# Patient Record
Sex: Male | Born: 1960 | Race: Black or African American | Hispanic: No | Marital: Married | Smoking: Never smoker
Health system: Southern US, Community
[De-identification: ages and names within clinical notes are randomized; demographics above are authoritative.]

## PROBLEM LIST (undated history)

## (undated) DIAGNOSIS — R7989 Other specified abnormal findings of blood chemistry: Secondary | ICD-10-CM

## (undated) DIAGNOSIS — G4730 Sleep apnea, unspecified: Secondary | ICD-10-CM

## (undated) DIAGNOSIS — M1990 Unspecified osteoarthritis, unspecified site: Secondary | ICD-10-CM

## (undated) DIAGNOSIS — E663 Overweight: Secondary | ICD-10-CM

## (undated) DIAGNOSIS — I10 Essential (primary) hypertension: Secondary | ICD-10-CM

## (undated) DIAGNOSIS — G510 Bell's palsy: Secondary | ICD-10-CM

## (undated) DIAGNOSIS — Z860100 Personal history of colon polyps, unspecified: Secondary | ICD-10-CM

## (undated) DIAGNOSIS — E876 Hypokalemia: Secondary | ICD-10-CM

## (undated) DIAGNOSIS — G4733 Obstructive sleep apnea (adult) (pediatric): Secondary | ICD-10-CM

## (undated) DIAGNOSIS — N529 Male erectile dysfunction, unspecified: Secondary | ICD-10-CM

## (undated) DIAGNOSIS — E7800 Pure hypercholesterolemia, unspecified: Secondary | ICD-10-CM

## (undated) HISTORY — DX: Essential (primary) hypertension: I10

## (undated) HISTORY — DX: Sleep apnea, unspecified: G47.30

## (undated) HISTORY — DX: Bell's palsy: G51.0

## (undated) HISTORY — PX: COLONOSCOPY W/ POLYPECTOMY: SHX1380

---

## 1993-11-07 HISTORY — PX: HERNIA REPAIR: SHX51

## 2000-11-07 HISTORY — PX: MENISCUS REPAIR: SHX5179

## 2002-01-05 DIAGNOSIS — Z8669 Personal history of other diseases of the nervous system and sense organs: Secondary | ICD-10-CM

## 2006-03-31 ENCOUNTER — Ambulatory Visit: Payer: Self-pay | Admitting: Internal Medicine

## 2006-05-23 ENCOUNTER — Ambulatory Visit: Payer: Self-pay | Admitting: Internal Medicine

## 2007-11-21 ENCOUNTER — Ambulatory Visit: Payer: Self-pay | Admitting: Family Medicine

## 2007-11-21 DIAGNOSIS — M7750 Other enthesopathy of unspecified foot: Secondary | ICD-10-CM | POA: Insufficient documentation

## 2007-11-21 DIAGNOSIS — I10 Essential (primary) hypertension: Secondary | ICD-10-CM | POA: Insufficient documentation

## 2007-12-18 ENCOUNTER — Ambulatory Visit: Payer: Self-pay | Admitting: Family Medicine

## 2007-12-18 ENCOUNTER — Telehealth: Payer: Self-pay | Admitting: Family Medicine

## 2007-12-19 ENCOUNTER — Ambulatory Visit: Payer: Self-pay | Admitting: Family Medicine

## 2007-12-19 DIAGNOSIS — E876 Hypokalemia: Secondary | ICD-10-CM | POA: Insufficient documentation

## 2007-12-21 LAB — CONVERTED CEMR LAB
ALT: 58 units/L — ABNORMAL HIGH (ref 0–53)
AST: 45 units/L — ABNORMAL HIGH (ref 0–37)
Albumin: 3.6 g/dL (ref 3.5–5.2)
Alkaline Phosphatase: 56 units/L (ref 39–117)
CO2: 34 meq/L — ABNORMAL HIGH (ref 19–32)
Calcium: 9 mg/dL (ref 8.4–10.5)
Chloride: 99 meq/L (ref 96–112)
Chloride: 100 meq/L (ref 96–112)
Creatinine, Ser: 1.2 mg/dL (ref 0.4–1.5)
Creatinine, Ser: 1.3 mg/dL (ref 0.4–1.5)
GFR calc non Af Amer: 69 mL/min
Glucose, Bld: 126 mg/dL — ABNORMAL HIGH (ref 70–99)
Sodium: 140 meq/L (ref 135–145)
Total Bilirubin: 0.9 mg/dL (ref 0.3–1.2)
Total CHOL/HDL Ratio: 6.5
VLDL: 29 mg/dL (ref 0–40)

## 2007-12-25 ENCOUNTER — Ambulatory Visit: Payer: Self-pay | Admitting: Family Medicine

## 2007-12-25 DIAGNOSIS — R7309 Other abnormal glucose: Secondary | ICD-10-CM | POA: Insufficient documentation

## 2008-01-02 ENCOUNTER — Ambulatory Visit: Payer: Self-pay | Admitting: Family Medicine

## 2008-01-02 ENCOUNTER — Telehealth: Payer: Self-pay | Admitting: Family Medicine

## 2008-01-07 DIAGNOSIS — E7800 Pure hypercholesterolemia, unspecified: Secondary | ICD-10-CM

## 2008-01-18 LAB — CONVERTED CEMR LAB
BUN: 8 mg/dL (ref 6–23)
CO2: 34 meq/L — ABNORMAL HIGH (ref 19–32)
Creatinine, Ser: 1.3 mg/dL (ref 0.4–1.5)
Glucose, Bld: 123 mg/dL — ABNORMAL HIGH (ref 70–99)
Potassium: 2.6 meq/L — CL (ref 3.5–5.1)
Sodium: 140 meq/L (ref 135–145)

## 2008-03-05 ENCOUNTER — Ambulatory Visit: Payer: Self-pay | Admitting: Family Medicine

## 2008-03-06 ENCOUNTER — Telehealth: Payer: Self-pay | Admitting: Family Medicine

## 2008-03-06 ENCOUNTER — Ambulatory Visit: Payer: Self-pay | Admitting: Family Medicine

## 2008-03-10 ENCOUNTER — Ambulatory Visit: Payer: Self-pay | Admitting: Family Medicine

## 2008-03-12 ENCOUNTER — Ambulatory Visit: Payer: Self-pay | Admitting: Family Medicine

## 2008-03-12 LAB — CONVERTED CEMR LAB: Aldosterone, Serum: 5

## 2008-03-14 ENCOUNTER — Ambulatory Visit: Payer: Self-pay | Admitting: Family Medicine

## 2008-03-20 ENCOUNTER — Ambulatory Visit: Payer: Self-pay | Admitting: Family Medicine

## 2008-04-02 ENCOUNTER — Encounter (INDEPENDENT_AMBULATORY_CARE_PROVIDER_SITE_OTHER): Payer: Self-pay | Admitting: *Deleted

## 2008-05-22 ENCOUNTER — Encounter (INDEPENDENT_AMBULATORY_CARE_PROVIDER_SITE_OTHER): Payer: Self-pay | Admitting: *Deleted

## 2008-05-28 ENCOUNTER — Encounter (INDEPENDENT_AMBULATORY_CARE_PROVIDER_SITE_OTHER): Payer: Self-pay | Admitting: *Deleted

## 2009-02-05 ENCOUNTER — Ambulatory Visit: Payer: Self-pay | Admitting: Family Medicine

## 2009-02-05 DIAGNOSIS — M549 Dorsalgia, unspecified: Secondary | ICD-10-CM | POA: Insufficient documentation

## 2009-02-05 DIAGNOSIS — IMO0002 Reserved for concepts with insufficient information to code with codable children: Secondary | ICD-10-CM | POA: Insufficient documentation

## 2009-02-13 ENCOUNTER — Ambulatory Visit: Payer: Self-pay | Admitting: Family Medicine

## 2009-03-25 ENCOUNTER — Telehealth: Payer: Self-pay | Admitting: Family Medicine

## 2009-04-02 ENCOUNTER — Ambulatory Visit: Payer: Self-pay | Admitting: Family Medicine

## 2009-04-09 ENCOUNTER — Telehealth: Payer: Self-pay | Admitting: Family Medicine

## 2009-04-10 ENCOUNTER — Ambulatory Visit: Payer: Self-pay | Admitting: Family Medicine

## 2009-04-10 DIAGNOSIS — R059 Cough, unspecified: Secondary | ICD-10-CM | POA: Insufficient documentation

## 2009-04-10 DIAGNOSIS — R05 Cough: Secondary | ICD-10-CM

## 2009-04-24 ENCOUNTER — Encounter: Admission: RE | Admit: 2009-04-24 | Discharge: 2009-04-24 | Payer: Self-pay | Admitting: Orthopaedic Surgery

## 2009-07-27 ENCOUNTER — Telehealth: Payer: Self-pay | Admitting: Family Medicine

## 2009-11-16 ENCOUNTER — Telehealth: Payer: Self-pay | Admitting: Family Medicine

## 2010-08-08 ENCOUNTER — Ambulatory Visit (HOSPITAL_BASED_OUTPATIENT_CLINIC_OR_DEPARTMENT_OTHER): Admission: RE | Admit: 2010-08-08 | Discharge: 2010-08-08 | Payer: Self-pay | Admitting: Nurse Practitioner

## 2010-08-14 ENCOUNTER — Ambulatory Visit: Payer: Self-pay | Admitting: Internal Medicine

## 2010-12-07 NOTE — Progress Notes (Signed)
Summary: Avapro Prior Authorization  Phone Note Outgoing Call Call back at [phone]   Call placed by: Linde Gillis CMA Duncan Dull),  November 16, 2009 11:47 AM Call placed to: [facility] Summary of Call: Received faxed form from [facility] for Avapro 150mg , #60 which the pharmacist says needs prior authorization.  Called [facility] and spoke with Estevan Ryder, the patient's insurance plan only covers 31 tablets in a 24 day period with no prior authorization option.  Please advise.   Initial call taken by: Linde Gillis CMA Duncan Dull),  November 16, 2009 11:50 AM  Follow-up for Phone Call        Can you call and see if they cover the 300 mg tablet...without prior auth for 30..if so we can change to that... I believe the 2 tabs daily was leftover from titrating med dose up.  Please also make sure pt has an appt for BP check   in next few months. Follow-up by: Kerby Nora MD,  November 16, 2009 4:02 PM  Additional Follow-up for Phone Call Additional follow up Details #1::        Spoke with Britta Mccreedy at [facility], per his plan no prior Berkley Harvey is needed for the 300mg  tablets.  Patient advised that he needs an appt for BP check in the next few months and he said I could call in the new Rx for the 300mg  tablets to [facility].  #30 with 3 refills called to pharmacy.  He will call back to schedule appt. Additional Follow-up by: Linde Gillis CMA Duncan Dull),  November 16, 2009 4:41 PM     Appended Document: Orders Update    Clinical Lists Changes  Medications: Changed medication from AVAPRO 150 MG  TABS (IRBESARTAN) Take 2 tablet by mouth once a day to AVAPRO 300 MG TABS (IRBESARTAN) Take 1 tablet by mouth once a day - Signed Rx of AVAPRO 300 MG TABS (IRBESARTAN) Take 1 tablet by mouth once a day;  #30 x 3;  Signed;  Entered by: Kerby Nora MD;  Authorized by: Kerby Nora MD;  Method used: Telephoned to [facility] [address], [place], [place]  [address], Ph: [phone], Fax: [phone]    Prescriptions: AVAPRO 300 MG TABS  (IRBESARTAN) Take 1 tablet by mouth once a day  #30 x 3   Entered and Authorized by:   Kerby Nora MD   Signed by:   Kerby Nora MD on 11/17/2009   Method used:   Telephoned to ...       [facility]* (retail)       [address]       [place], [place]  [address]       Ph: [phone]       Fax: [phone]   RxID:   [id number]

## 2011-06-05 ENCOUNTER — Emergency Department (HOSPITAL_COMMUNITY)
Admission: EM | Admit: 2011-06-05 | Discharge: 2011-06-05 | Disposition: A | Discharge status: Home / Self Care | Payer: Self-pay | Attending: Emergency Medicine | Admitting: Emergency Medicine

## 2011-06-05 ENCOUNTER — Emergency Department (HOSPITAL_COMMUNITY): Payer: Self-pay

## 2011-06-05 DIAGNOSIS — S63509A Unspecified sprain of unspecified wrist, initial encounter: Secondary | ICD-10-CM | POA: Insufficient documentation

## 2011-06-05 DIAGNOSIS — M7989 Other specified soft tissue disorders: Secondary | ICD-10-CM | POA: Insufficient documentation

## 2011-06-05 DIAGNOSIS — Y9289 Other specified places as the place of occurrence of the external cause: Secondary | ICD-10-CM | POA: Insufficient documentation

## 2011-06-05 DIAGNOSIS — I10 Essential (primary) hypertension: Secondary | ICD-10-CM | POA: Insufficient documentation

## 2011-06-05 DIAGNOSIS — M25539 Pain in unspecified wrist: Secondary | ICD-10-CM | POA: Insufficient documentation

## 2011-06-05 DIAGNOSIS — W208XXA Other cause of strike by thrown, projected or falling object, initial encounter: Secondary | ICD-10-CM | POA: Insufficient documentation

## 2011-06-06 ENCOUNTER — Emergency Department (HOSPITAL_COMMUNITY)
Admission: EM | Admit: 2011-06-06 | Discharge: 2011-06-06 | Disposition: A | Discharge status: Home / Self Care | Payer: Worker's Compensation | Attending: Emergency Medicine | Admitting: Emergency Medicine

## 2011-06-06 DIAGNOSIS — Y990 Civilian activity done for income or pay: Secondary | ICD-10-CM | POA: Insufficient documentation

## 2011-06-06 DIAGNOSIS — M25439 Effusion, unspecified wrist: Secondary | ICD-10-CM | POA: Insufficient documentation

## 2011-06-06 DIAGNOSIS — Z79899 Other long term (current) drug therapy: Secondary | ICD-10-CM | POA: Insufficient documentation

## 2011-06-06 DIAGNOSIS — S6990XA Unspecified injury of unspecified wrist, hand and finger(s), initial encounter: Secondary | ICD-10-CM | POA: Insufficient documentation

## 2011-06-06 DIAGNOSIS — M25539 Pain in unspecified wrist: Secondary | ICD-10-CM | POA: Insufficient documentation

## 2011-06-06 DIAGNOSIS — S62009A Unspecified fracture of navicular [scaphoid] bone of unspecified wrist, initial encounter for closed fracture: Secondary | ICD-10-CM | POA: Insufficient documentation

## 2011-06-06 DIAGNOSIS — W208XXA Other cause of strike by thrown, projected or falling object, initial encounter: Secondary | ICD-10-CM | POA: Insufficient documentation

## 2011-06-06 DIAGNOSIS — I10 Essential (primary) hypertension: Secondary | ICD-10-CM | POA: Insufficient documentation

## 2011-06-06 DIAGNOSIS — S59909A Unspecified injury of unspecified elbow, initial encounter: Secondary | ICD-10-CM | POA: Insufficient documentation

## 2011-11-08 HISTORY — PX: COLONOSCOPY: SHX174

## 2012-01-31 ENCOUNTER — Telehealth: Payer: Self-pay | Admitting: Internal Medicine

## 2012-01-31 NOTE — Telephone Encounter (Signed)
[phone] Pt wife called to make new patient appointment made for 04/11/12 wanted to be seen sooner.  His bp is high yesterday 180/110 Pt wife stated bp is always high.  Pt dr could not get right combo of meds to control.  His dr left her practice and he has not primary care dr.

## 2012-02-01 NOTE — Telephone Encounter (Signed)
We can put him in next 30-45 min slot.

## 2012-02-01 NOTE — Telephone Encounter (Signed)
Appointment 03/06/12 pt wife aware of appointment

## 2012-03-06 ENCOUNTER — Ambulatory Visit: Payer: Self-pay | Admitting: Internal Medicine

## 2012-03-07 ENCOUNTER — Other Ambulatory Visit: Payer: Self-pay | Admitting: *Deleted

## 2012-03-07 MED ORDER — AMLODIPINE BESYLATE 10 MG PO TABS: 10.0000 mg | ORAL_TABLET | Freq: Every day | ORAL | Status: DC

## 2012-03-07 NOTE — Telephone Encounter (Signed)
30-day supply given for NP w/05.20.13 OV scheduled per VO JAW/SLS

## 2012-03-26 ENCOUNTER — Ambulatory Visit (INDEPENDENT_AMBULATORY_CARE_PROVIDER_SITE_OTHER): Payer: 59 | Admitting: Internal Medicine

## 2012-03-26 ENCOUNTER — Encounter: Payer: Self-pay | Admitting: Internal Medicine

## 2012-03-26 VITALS — BP 208/118 | HR 61 | Temp 97.4°F | Resp 16 | Ht 70.5 in | Wt 257.0 lb

## 2012-03-26 DIAGNOSIS — Z23 Encounter for immunization: Secondary | ICD-10-CM

## 2012-03-26 DIAGNOSIS — M25569 Pain in unspecified knee: Secondary | ICD-10-CM

## 2012-03-26 DIAGNOSIS — Z1211 Encounter for screening for malignant neoplasm of colon: Secondary | ICD-10-CM

## 2012-03-26 DIAGNOSIS — E1165 Type 2 diabetes mellitus with hyperglycemia: Secondary | ICD-10-CM | POA: Insufficient documentation

## 2012-03-26 DIAGNOSIS — M25562 Pain in left knee: Secondary | ICD-10-CM | POA: Insufficient documentation

## 2012-03-26 DIAGNOSIS — I10 Essential (primary) hypertension: Secondary | ICD-10-CM | POA: Insufficient documentation

## 2012-03-26 DIAGNOSIS — E119 Type 2 diabetes mellitus without complications: Secondary | ICD-10-CM

## 2012-03-26 MED ORDER — LOSARTAN POTASSIUM-HCTZ 100-25 MG PO TABS: 1.0000 | ORAL_TABLET | Freq: Every day | ORAL | Status: DC

## 2012-03-26 MED ORDER — AMLODIPINE BESYLATE 10 MG PO TABS: 10.0000 mg | ORAL_TABLET | Freq: Every day | ORAL | Status: DC

## 2012-03-26 NOTE — Progress Notes (Signed)
Subjective:    Patient ID: Bradley Perez, male    DOB: 1961-05-24, 51 y.o.   MRN: [id number]  HPI 51 year old male with history of hypertension presents to establish care. In regards to his hypertension, he reports that he has been out of his Norvasc. He has been taking his angiotensin receptor blocker and Bystolic. He denies any headache, palpitations, chest pain. He reports that he is generally feeling well.  He also has a history of prediabetes. He reports full compliance with metformin. He does not regularly check his blood sugars. He reports that his hemoglobin A1c has been near 7% in the past.  His primary concern today is pain and swelling in his left knee. This has been present for several months. He notices this primarily after playing sports such as basketball. He notes some swelling posterior to his left knee. He denies any known injury to his knee. He has not been taking any medication for this.  Outpatient Encounter Prescriptions as of 03/26/2012  Medication Sig Dispense Refill  . amLODipine (NORVASC) 10 MG tablet Take 1 tablet (10 mg total) by mouth daily.  30 tablet  6  . metFORMIN (GLUCOPHAGE) 500 MG tablet Take 500 mg by mouth daily with breakfast.      . multivitamin (ONE-A-DAY MEN'S) TABS Take 1 tablet by mouth daily.      . nebivolol (BYSTOLIC) 10 MG tablet Take 30 mg by mouth daily.      Marland Kitchen losartan-hydrochlorothiazide (HYZAAR) 100-25 MG per tablet Take 1 tablet by mouth daily.  30 tablet  6   BP 208/118  Pulse 61  Temp(Src) 97.4 F (36.3 C) (Oral)  Resp 16  Ht 5' 10.5" (1.791 m)  Wt 257 lb (116.574 kg)  BMI 36.35 kg/m2  SpO2 96%  Review of Systems  Constitutional: Negative for fever, chills, activity change, appetite change, fatigue and unexpected weight change.  Eyes: Negative for visual disturbance.  Respiratory: Negative for cough and shortness of breath.   Cardiovascular: Negative for chest pain, palpitations and leg swelling.  Gastrointestinal: Negative  for abdominal pain and abdominal distention.  Genitourinary: Negative for dysuria, urgency and difficulty urinating.  Musculoskeletal: Positive for myalgias, joint swelling and arthralgias. Negative for gait problem.  Skin: Negative for color change and rash.  Hematological: Negative for adenopathy.  Psychiatric/Behavioral: Negative for sleep disturbance and dysphoric mood. The patient is not nervous/anxious.    BP 208/118  Pulse 61  Temp(Src) 97.4 F (36.3 C) (Oral)  Resp 16  Ht 5' 10.5" (1.791 m)  Wt 257 lb (116.574 kg)  BMI 36.35 kg/m2  SpO2 96%     Objective:   Physical Exam  Constitutional: He is oriented to person, place, and time. He appears well-developed and well-nourished. No distress.  HENT:  Head: Normocephalic and atraumatic.  Right Ear: External ear normal.  Left Ear: External ear normal.  Nose: Nose normal.  Mouth/Throat: Oropharynx is clear and moist. No oropharyngeal exudate.  Eyes: Conjunctivae and EOM are normal. Pupils are equal, round, and reactive to light. Right eye exhibits no discharge. Left eye exhibits no discharge. No scleral icterus.  Neck: Normal range of motion. Neck supple. No tracheal deviation present. No thyromegaly present.  Cardiovascular: Normal rate, regular rhythm and normal heart sounds.  Exam reveals no gallop and no friction rub.   No murmur heard. Pulmonary/Chest: Effort normal and breath sounds normal. No respiratory distress. He has no wheezes. He has no rales. He exhibits no tenderness.  Abdominal: Soft. Bowel sounds are normal. He  exhibits no distension and no mass. There is no tenderness. There is no guarding.  Musculoskeletal: Normal range of motion. He exhibits no edema.       Left knee: He exhibits swelling and effusion. tenderness found.       Legs: Lymphadenopathy:    He has no cervical adenopathy.  Neurological: He is alert and oriented to person, place, and time. No cranial nerve deficit. Coordination normal.  Skin: Skin  is warm and dry. No rash noted. He is not diaphoretic. No erythema. No pallor.  Psychiatric: He has a normal mood and affect. His behavior is normal. Judgment and thought content normal.          Assessment & Plan:

## 2012-03-26 NOTE — Assessment & Plan Note (Signed)
Blood pressure markedly elevated today. He will restart Norvasc. Will change Edarby to losartan because of cost. Will continue Bystolic. Patient will monitor blood pressure at home and email with results. He will return to clinic in one week. We'll check renal function with labs today.

## 2012-03-26 NOTE — Assessment & Plan Note (Signed)
Question if he may have a Baker's cyst based on exam. Will set him up evaluation with orthopedics.

## 2012-03-26 NOTE — Assessment & Plan Note (Signed)
Will check A1c with labs today. We'll continue metformin.

## 2012-03-26 NOTE — Assessment & Plan Note (Signed)
Will set up screening colonoscopy. 

## 2012-03-27 ENCOUNTER — Telehealth: Payer: Self-pay | Admitting: *Deleted

## 2012-03-27 DIAGNOSIS — E876 Hypokalemia: Secondary | ICD-10-CM

## 2012-03-27 LAB — LIPID PANEL
HDL: 38.4 mg/dL — ABNORMAL LOW (ref >39.00)
Triglycerides: 301 mg/dL — ABNORMAL HIGH (ref 0.0–149.0)
VLDL: 60.2 mg/dL — ABNORMAL HIGH (ref 0.0–40.0)

## 2012-03-27 LAB — COMPREHENSIVE METABOLIC PANEL
ALT: 35 U/L (ref 0–53)
Alkaline Phosphatase: 71 U/L (ref 39–117)
BUN: 17 mg/dL (ref 6–23)
CO2: 28 mEq/L (ref 19–32)
Chloride: 99 mEq/L (ref 96–112)
GFR: 74.26 mL/min (ref >60.00)
Potassium: 2.7 mEq/L — CL (ref 3.5–5.1)
Total Bilirubin: 0.4 mg/dL (ref 0.3–1.2)
Total Protein: 7.4 g/dL (ref 6.0–8.3)

## 2012-03-27 LAB — MICROALBUMIN / CREATININE URINE RATIO: Creatinine,U: 230.4 mg/dL

## 2012-03-27 LAB — HEMOGLOBIN A1C: Hgb A1c MFr Bld: 7.5 % — ABNORMAL HIGH (ref 4.6–6.5)

## 2012-03-27 MED ORDER — POTASSIUM CHLORIDE 40 MEQ/15ML (20%) PO LIQD: ORAL | Status: DC

## 2012-03-27 NOTE — Telephone Encounter (Signed)
FYI: Critical Lab Report: Potassium 2.7

## 2012-03-27 NOTE — Telephone Encounter (Signed)
Per VO, Dr. Darrick Huntsman, after critical lab result: sent in Potassium 40 meq [twice daily on day 1 only, then] once daily to pharmacy; patient cannot swallow pill, must have liquid. Will repeat BMP when patient returns to office next week, order placed/SLS Patient's wife informed.

## 2012-03-28 ENCOUNTER — Other Ambulatory Visit: Payer: Self-pay | Admitting: *Deleted

## 2012-03-28 MED ORDER — ATORVASTATIN CALCIUM 20 MG PO TABS: 20.0000 mg | ORAL_TABLET | Freq: Every day | ORAL | Status: DC

## 2012-04-05 ENCOUNTER — Ambulatory Visit: Payer: Self-pay | Admitting: Internal Medicine

## 2012-04-11 ENCOUNTER — Ambulatory Visit: Payer: Self-pay | Admitting: Internal Medicine

## 2012-04-18 ENCOUNTER — Ambulatory Visit: Payer: Self-pay | Admitting: Internal Medicine

## 2012-04-18 DIAGNOSIS — Z0289 Encounter for other administrative examinations: Secondary | ICD-10-CM

## 2012-05-17 ENCOUNTER — Ambulatory Visit (AMBULATORY_SURGERY_CENTER): Payer: 59

## 2012-05-17 VITALS — Ht 71.5 in | Wt 262.0 lb

## 2012-05-17 DIAGNOSIS — Z1211 Encounter for screening for malignant neoplasm of colon: Secondary | ICD-10-CM

## 2012-05-17 MED ORDER — MOVIPREP 100 G PO SOLR: ORAL | Status: DC

## 2012-05-18 ENCOUNTER — Encounter: Payer: Self-pay | Admitting: Internal Medicine

## 2012-05-31 ENCOUNTER — Encounter: Payer: Self-pay | Admitting: Internal Medicine

## 2012-05-31 ENCOUNTER — Ambulatory Visit (AMBULATORY_SURGERY_CENTER): Payer: 59 | Admitting: Internal Medicine

## 2012-05-31 VITALS — BP 178/93 | HR 79 | Temp 96.9°F | Resp 24 | Ht 71.5 in | Wt 262.0 lb

## 2012-05-31 DIAGNOSIS — Z1211 Encounter for screening for malignant neoplasm of colon: Secondary | ICD-10-CM

## 2012-05-31 DIAGNOSIS — D126 Benign neoplasm of colon, unspecified: Secondary | ICD-10-CM

## 2012-05-31 LAB — GLUCOSE, CAPILLARY: Glucose-Capillary: 165 mg/dL — ABNORMAL HIGH (ref 70–99)

## 2012-05-31 LAB — HM COLONOSCOPY

## 2012-05-31 MED ORDER — SODIUM CHLORIDE 0.9 % IV SOLN: 500.0000 mL | INTRAVENOUS | Status: DC

## 2012-05-31 NOTE — Patient Instructions (Addendum)

## 2012-05-31 NOTE — Addendum Note (Signed)
Addended by: Ginette Pitman on: 05/31/2012 11:26 AM   Modules accepted: Orders

## 2012-05-31 NOTE — Op Note (Signed)
[facility] [address] [place], [place]  [address]  COLONOSCOPY PROCEDURE REPORT  PATIENT:  Bradley Perez, Bradley Perez  MR#:  [id number] BIRTHDATE:  1961/03/08, 50 yrs. old  GENDER:  male ENDOSCOPIST:  Carie Caddy. Kyden Potash, MD REF. BY:  Ronna Polio, M.D. PROCEDURE DATE:  05/31/2012 PROCEDURE:  Colon with cold biopsy polypectomy ASA CLASS:  Class II INDICATIONS:  Routine Risk Screening, 1st colonoscopy MEDICATIONS:   MAC sedation, administered by CRNA, propofol (Diprivan) 250 mg IV  DESCRIPTION OF PROCEDURE:   After the risks benefits and alternatives of the procedure were thoroughly explained, informed consent was obtained.  Digital rectal exam was performed and revealed no rectal masses.   The LB CF-[id number] endoscope was introduced through the anus and advanced to the terminal ileum which was intubated for a short distance, without limitations. The quality of the prep was good, using MoviPrep.  The instrument was then slowly withdrawn as the colon was fully examined. <<PROCEDUREIMAGES>>  FINDINGS:  The terminal ileum appeared normal.  A 3 mm sessile polyp was found in the transverse colon. The polyp was removed using cold biopsy forceps.  A 3 mm sessile polyp was found in the recto-sigmoid colon. The polyp was removed using cold biopsy forceps.  A 2 mm sessile polyp was found in the rectum. The polyp was removed using cold biopsy forceps.   Retroflexed views in the rectum revealed no abnormalities.     The scope was then withdrawn from the cecum and the procedure completed.  COMPLICATIONS:  None  ENDOSCOPIC IMPRESSION: 1) Normal terminal ileum 2) Sessile polyp in the transverse colon.  Removed and sent to pathology. 3) Sessile polyp in the recto-sigmoid colon. Removed and sent to pathology. 4) Sessile polyp in the rectum. Removed and sent to pathology.  RECOMMENDATIONS: 1) Await pathology results 2) If the polyps removed today are proven to be  adenomatous (pre-cancerous) polyps, you will need a colonoscopy in 3 years. If only 1 or 2 is adenomatous, then you will need a repeat colonoscopy in 5 years. Otherwise you should continue to follow colorectal cancer screening guidelines for "routine risk" patients with a colonoscopy in 10 years. You will receive a letter within 1-2 weeks with the results of your biopsy as well as final recommendations. Please call my office if you have not received a letter after 3 weeks.  Carie Caddy. Rhea Belton, MD  CC:  The Patient Ronna Polio MD  n. eSIGNEDCarie Caddy. Audrianna Driskill at 05/31/2012 10:39 AM  Albertha Ghee, [phone]

## 2012-05-31 NOTE — Progress Notes (Signed)
Patient did not experience any of the following events: a burn prior to discharge; a fall within the facility; wrong site/side/patient/procedure/implant event; or a hospital transfer or hospital admission upon discharge from the facility. (G8907) Patient did not have preoperative order for IV antibiotic SSI prophylaxis. (G8918)  

## 2012-06-01 ENCOUNTER — Telehealth: Payer: Self-pay | Admitting: *Deleted

## 2012-06-01 NOTE — Telephone Encounter (Signed)
NO ANSWER, MESSAGE LEFT FOR THE PATIENT. 

## 2012-06-07 ENCOUNTER — Encounter: Payer: Self-pay | Admitting: Internal Medicine

## 2012-07-30 ENCOUNTER — Other Ambulatory Visit: Payer: Self-pay | Admitting: Orthopedic Surgery

## 2012-07-30 ENCOUNTER — Encounter (HOSPITAL_COMMUNITY): Payer: Self-pay | Admitting: Pharmacy Technician

## 2012-07-30 MED ORDER — DEXAMETHASONE SODIUM PHOSPHATE 10 MG/ML IJ SOLN: 10.0000 mg | Freq: Once | INTRAMUSCULAR | Status: DC

## 2012-07-30 NOTE — Progress Notes (Signed)
Preoperative surgical orders have been place into the Epic hospital system for Bradley Perez on 07/30/2012, 9:54 AM  by Patrica Duel for surgery on 08/08/2012.  Preop Knee Scope orders including IV Tylenol and IV Decadron as long as there are no contraindications to the above medications. Avel Peace, PA-C

## 2012-08-06 ENCOUNTER — Other Ambulatory Visit: Payer: Self-pay

## 2012-08-06 ENCOUNTER — Encounter (HOSPITAL_COMMUNITY)
Admission: RE | Admit: 2012-08-06 | Discharge: 2012-08-06 | Disposition: A | Discharge status: Home / Self Care | Payer: 59 | Source: Ambulatory Visit | Attending: Orthopedic Surgery | Admitting: Orthopedic Surgery

## 2012-08-06 ENCOUNTER — Ambulatory Visit (HOSPITAL_COMMUNITY)
Admission: RE | Admit: 2012-08-06 | Discharge: 2012-08-06 | Disposition: A | Discharge status: Home / Self Care | Payer: 59 | Source: Ambulatory Visit | Attending: Orthopedic Surgery | Admitting: Orthopedic Surgery

## 2012-08-06 DIAGNOSIS — Z01818 Encounter for other preprocedural examination: Secondary | ICD-10-CM | POA: Insufficient documentation

## 2012-08-06 DIAGNOSIS — Z01810 Encounter for preprocedural cardiovascular examination: Secondary | ICD-10-CM | POA: Insufficient documentation

## 2012-08-06 DIAGNOSIS — Z01812 Encounter for preprocedural laboratory examination: Secondary | ICD-10-CM | POA: Insufficient documentation

## 2012-08-06 LAB — CBC
HCT: 43.1 % (ref 39.0–52.0)
Hemoglobin: 15.4 g/dL (ref 13.0–17.0)
MCHC: 35.7 g/dL (ref 30.0–36.0)
MCV: 78.4 fL (ref 78.0–100.0)
WBC: 8.3 10*3/uL (ref 4.0–10.5)

## 2012-08-06 LAB — SURGICAL PCR SCREEN: Staphylococcus aureus: NEGATIVE

## 2012-08-06 LAB — BASIC METABOLIC PANEL
CO2: 33 mEq/L — ABNORMAL HIGH (ref 19–32)
Chloride: 94 mEq/L — ABNORMAL LOW (ref 96–112)
Glucose, Bld: 250 mg/dL — ABNORMAL HIGH (ref 70–99)
Sodium: 138 mEq/L (ref 135–145)

## 2012-08-06 NOTE — Patient Instructions (Signed)
20      Your procedure is scheduled on:  Wednesday 08/08/2012 at 1200 pm  Report to [facility] at  0930  AM.  Call this number if you have problems the morning of surgery: [phone]   Remember:   Do not eat food or drink liquids after midnight!  Take these medicines the morning of surgery with A SIP OF WATER: Norvasc, Atorvastatin   Do not bring valuables to the hospital.  .  Leave suitcase in the car. After surgery it may be brought to your room.  For patients admitted to the hospital, checkout time is 11:00 AM the day of              Discharge.    Special Instructions: See [facility] Preparing  For Surgery Instruction Sheet. Do not wear jewelry, lotions powders, perfumes. Women do not shave legs or underarms for 12 hours before showers. Contacts, partial plates, or dentures may not be worn into surgery.                          Patients discharged the day of surgery will not be allowed to drive home.  If going home the same day of surgery, must have someone stay with you first 24 hrs.at home and arrange for someone to drive you home from the              Hospital.   Please read over the following fact sheets that you were given: MRSA              INFORMATION, Incentive Spirometry sheet, Sleep apnea sheet               Telford Nab.Joselynne Killam,RN,BSN [phone]

## 2012-08-06 NOTE — Progress Notes (Signed)
08/06/12 1346  OBSTRUCTIVE SLEEP APNEA  Have you ever been diagnosed with sleep apnea through a sleep study? No  Do you snore loudly (loud enough to be heard through closed doors)?  1  Do you often feel tired, fatigued, or sleepy during the daytime? 1  Has anyone observed you stop breathing during your sleep? 0  Do you have, or are you being treated for high blood pressure? 1  BMI more than 35 kg/m2? 1  Age over 51 years old? 1  Neck circumference greater than 40 cm/18 inches? 1  Gender: 1  Obstructive Sleep Apnea Score 7   Score 4 or greater  Results sent to PCP

## 2012-08-08 ENCOUNTER — Encounter (HOSPITAL_COMMUNITY): Payer: Self-pay | Admitting: Anesthesiology

## 2012-08-08 ENCOUNTER — Other Ambulatory Visit: Payer: Self-pay | Admitting: Orthopedic Surgery

## 2012-08-08 ENCOUNTER — Encounter (HOSPITAL_COMMUNITY): Payer: Self-pay | Admitting: *Deleted

## 2012-08-08 ENCOUNTER — Ambulatory Visit (HOSPITAL_COMMUNITY)
Admission: RE | Admit: 2012-08-08 | Discharge: 2012-08-08 | Disposition: A | Discharge status: Home / Self Care | Payer: 59 | Source: Ambulatory Visit | Attending: Orthopedic Surgery | Admitting: Orthopedic Surgery

## 2012-08-08 ENCOUNTER — Encounter (HOSPITAL_COMMUNITY)
Admission: RE | Disposition: A | Discharge status: Home / Self Care | Payer: Self-pay | Source: Ambulatory Visit | Attending: Orthopedic Surgery

## 2012-08-08 ENCOUNTER — Ambulatory Visit (HOSPITAL_COMMUNITY): Payer: 59 | Admitting: Anesthesiology

## 2012-08-08 DIAGNOSIS — Z79899 Other long term (current) drug therapy: Secondary | ICD-10-CM | POA: Insufficient documentation

## 2012-08-08 DIAGNOSIS — M2340 Loose body in knee, unspecified knee: Secondary | ICD-10-CM | POA: Insufficient documentation

## 2012-08-08 DIAGNOSIS — E119 Type 2 diabetes mellitus without complications: Secondary | ICD-10-CM | POA: Insufficient documentation

## 2012-08-08 DIAGNOSIS — S83249A Other tear of medial meniscus, current injury, unspecified knee, initial encounter: Secondary | ICD-10-CM | POA: Diagnosis present

## 2012-08-08 DIAGNOSIS — I10 Essential (primary) hypertension: Secondary | ICD-10-CM | POA: Insufficient documentation

## 2012-08-08 DIAGNOSIS — X58XXXA Exposure to other specified factors, initial encounter: Secondary | ICD-10-CM | POA: Insufficient documentation

## 2012-08-08 DIAGNOSIS — M9420 Chondromalacia, unspecified site: Secondary | ICD-10-CM | POA: Insufficient documentation

## 2012-08-08 DIAGNOSIS — IMO0002 Reserved for concepts with insufficient information to code with codable children: Secondary | ICD-10-CM | POA: Insufficient documentation

## 2012-08-08 HISTORY — PX: KNEE ARTHROSCOPY: SHX127

## 2012-08-08 LAB — GLUCOSE, CAPILLARY
Glucose-Capillary: 225 mg/dL — ABNORMAL HIGH (ref 70–99)
Glucose-Capillary: 168 mg/dL — ABNORMAL HIGH (ref 70–99)

## 2012-08-08 SURGERY — ARTHROSCOPY, KNEE
Anesthesia: General | Site: Knee | Laterality: Left | Wound class: Clean

## 2012-08-08 MED ORDER — OXYCODONE-ACETAMINOPHEN 5-325 MG PO TABS: 1.0000 | ORAL_TABLET | ORAL | Status: DC | PRN

## 2012-08-08 MED ORDER — PROMETHAZINE HCL 25 MG/ML IJ SOLN: 6.2500 mg | INTRAMUSCULAR | Status: DC | PRN

## 2012-08-08 MED ORDER — ACETAMINOPHEN 10 MG/ML IV SOLN
INTRAVENOUS | Status: AC
  Filled 2012-08-08: qty 100

## 2012-08-08 MED ORDER — AMLODIPINE BESYLATE 10 MG PO TABS
10.0000 mg | ORAL_TABLET | ORAL | Status: AC
  Administered 2012-08-08: 10 mg via ORAL
  Filled 2012-08-08: qty 1

## 2012-08-08 MED ORDER — FENTANYL CITRATE 0.05 MG/ML IJ SOLN
INTRAMUSCULAR | Status: DC | PRN
  Administered 2012-08-08 (×4): 50 ug via INTRAVENOUS

## 2012-08-08 MED ORDER — MIDAZOLAM HCL 5 MG/5ML IJ SOLN
INTRAMUSCULAR | Status: DC | PRN
  Administered 2012-08-08: 1 mg via INTRAVENOUS

## 2012-08-08 MED ORDER — CEFAZOLIN SODIUM-DEXTROSE 2-3 GM-% IV SOLR
INTRAVENOUS | Status: AC
  Filled 2012-08-08: qty 50

## 2012-08-08 MED ORDER — BUPIVACAINE-EPINEPHRINE PF 0.25-1:200000 % IJ SOLN
INTRAMUSCULAR | Status: AC
  Filled 2012-08-08: qty 30

## 2012-08-08 MED ORDER — FENTANYL CITRATE 0.05 MG/ML IJ SOLN
25.0000 ug | INTRAMUSCULAR | Status: DC | PRN
  Administered 2012-08-08 (×3): 50 ug via INTRAVENOUS

## 2012-08-08 MED ORDER — DEXTROSE 5 % IV SOLN
3.0000 g | INTRAVENOUS | Status: AC
  Administered 2012-08-08: 2 g via INTRAVENOUS
  Filled 2012-08-08: qty 3000

## 2012-08-08 MED ORDER — BUPIVACAINE-EPINEPHRINE 0.25% -1:200000 IJ SOLN
INTRAMUSCULAR | Status: DC | PRN
  Administered 2012-08-08: 20 mL

## 2012-08-08 MED ORDER — ACETAMINOPHEN 10 MG/ML IV SOLN: 1000.0000 mg | Freq: Once | INTRAVENOUS | Status: DC

## 2012-08-08 MED ORDER — PROPOFOL 10 MG/ML IV EMUL
INTRAVENOUS | Status: DC | PRN
  Administered 2012-08-08: 250 mg via INTRAVENOUS

## 2012-08-08 MED ORDER — LACTATED RINGERS IR SOLN
Status: DC | PRN
  Administered 2012-08-08: 6000 mL

## 2012-08-08 MED ORDER — INSULIN ASPART 100 UNIT/ML ~~LOC~~ SOLN
4.0000 [IU] | Freq: Once | SUBCUTANEOUS | Status: AC
  Administered 2012-08-08: 4 [IU] via SUBCUTANEOUS
  Filled 2012-08-08: qty 1

## 2012-08-08 MED ORDER — LIDOCAINE HCL (CARDIAC) 20 MG/ML IV SOLN
INTRAVENOUS | Status: DC | PRN
  Administered 2012-08-08: 30 mg via INTRAVENOUS

## 2012-08-08 MED ORDER — KETAMINE HCL 10 MG/ML IJ SOLN
INTRAMUSCULAR | Status: DC | PRN
  Administered 2012-08-08 (×2): 5 mg via INTRAVENOUS

## 2012-08-08 MED ORDER — SODIUM CHLORIDE 0.9 % IV SOLN: INTRAVENOUS | Status: DC

## 2012-08-08 MED ORDER — ONDANSETRON HCL 4 MG/2ML IJ SOLN
INTRAMUSCULAR | Status: DC | PRN
  Administered 2012-08-08 (×2): 2 mg via INTRAVENOUS

## 2012-08-08 MED ORDER — METHOCARBAMOL 500 MG PO TABS: 500.0000 mg | ORAL_TABLET | Freq: Four times a day (QID) | ORAL | Status: DC

## 2012-08-08 MED ORDER — FENTANYL CITRATE 0.05 MG/ML IJ SOLN
INTRAMUSCULAR | Status: AC
  Filled 2012-08-08: qty 2

## 2012-08-08 MED ORDER — ACETAMINOPHEN 10 MG/ML IV SOLN
INTRAVENOUS | Status: DC | PRN
  Administered 2012-08-08: 1000 mg via INTRAVENOUS

## 2012-08-08 MED ORDER — LACTATED RINGERS IV SOLN
INTRAVENOUS | Status: DC
  Administered 2012-08-08: 1000 mL via INTRAVENOUS

## 2012-08-08 MED ORDER — LACTATED RINGERS IV SOLN
INTRAVENOUS | Status: DC | PRN
  Administered 2012-08-08: 12:00:00 via INTRAVENOUS

## 2012-08-08 SURGICAL SUPPLY — 28 items
BANDAGE ELASTIC 6 VELCRO ST LF (GAUZE/BANDAGES/DRESSINGS) ×1 IMPLANT
BLADE 4.2CUDA (BLADE) ×2 IMPLANT
CLOTH BEACON ORANGE TIMEOUT ST (SAFETY) ×2 IMPLANT
CUFF TOURN SGL QUICK 34 (TOURNIQUET CUFF) ×2
CUFF TRNQT CYL 34X4X40X1 (TOURNIQUET CUFF) ×1 IMPLANT
DRAPE U-SHAPE 47X51 STRL (DRAPES) ×2 IMPLANT
DRSG EMULSION OIL 3X3 NADH (GAUZE/BANDAGES/DRESSINGS) ×2 IMPLANT
DRSG PAD ABDOMINAL 8X10 ST (GAUZE/BANDAGES/DRESSINGS) ×2 IMPLANT
DURAPREP 26ML APPLICATOR (WOUND CARE) ×2 IMPLANT
GLOVE BIO SURGEON STRL SZ7.5 (GLOVE) ×2 IMPLANT
GLOVE BIO SURGEON STRL SZ8 (GLOVE) ×2 IMPLANT
GLOVE BIOGEL PI IND STRL 8 (GLOVE) ×1 IMPLANT
GLOVE BIOGEL PI INDICATOR 8 (GLOVE) ×1
GOWN STRL NON-REIN LRG LVL3 (GOWN DISPOSABLE) ×2 IMPLANT
MANIFOLD NEPTUNE II (INSTRUMENTS) ×4 IMPLANT
PACK ARTHROSCOPY WL (CUSTOM PROCEDURE TRAY) ×2 IMPLANT
PACK ICE MAXI GEL EZY WRAP (MISCELLANEOUS) ×3 IMPLANT
PADDING CAST ABS 6INX4YD NS (CAST SUPPLIES) ×1
PADDING CAST ABS COTTON 6X4 NS (CAST SUPPLIES) IMPLANT
PADDING CAST COTTON 6X4 STRL (CAST SUPPLIES) ×2 IMPLANT
POSITIONER SURGICAL ARM (MISCELLANEOUS) ×2 IMPLANT
SET ARTHROSCOPY TUBING (MISCELLANEOUS) ×2
SET ARTHROSCOPY TUBING LN (MISCELLANEOUS) ×1 IMPLANT
SPONGE GAUZE 4X4 12PLY (GAUZE/BANDAGES/DRESSINGS) ×1 IMPLANT
SUT ETHILON 4 0 PS 2 18 (SUTURE) ×2 IMPLANT
TOWEL OR 17X26 10 PK STRL BLUE (TOWEL DISPOSABLE) ×2 IMPLANT
WAND 90 DEG TURBOVAC W/CORD (SURGICAL WAND) ×2 IMPLANT
WRAP KNEE MAXI GEL POST OP (GAUZE/BANDAGES/DRESSINGS) ×2 IMPLANT

## 2012-08-08 NOTE — Interval H&P Note (Signed)
History and Physical Interval Note:  08/08/2012 11:54 AM  Bradley Perez  has presented today for surgery, with the diagnosis of Left Knee Medial Mensical Tear  The various methods of treatment have been discussed with the patient and family. After consideration of risks, benefits and other options for treatment, the patient has consented to  Procedure(s) (LRB) with comments: ARTHROSCOPY KNEE (Left) - with debridement as a surgical intervention .  The patient's history has been reviewed, patient examined, no change in status, stable for surgery.  I have reviewed the patient's chart and labs.  Questions were answered to the patient's satisfaction.     Loanne Drilling

## 2012-08-08 NOTE — Progress Notes (Signed)
Ortho tech here to teach pt how to ambulate with crutches toe touching with left foot. Pt returns demonstration. Ice pack for left knee given to pt to take home.

## 2012-08-08 NOTE — Transfer of Care (Signed)
Immediate Anesthesia Transfer of Care Note  Patient: Bradley Perez  Procedure(s) Performed: Procedure(s) (LRB) with comments: ARTHROSCOPY KNEE (Left) - medial meniscal debridement  Patient Location: PACU  Anesthesia Type: General  Level of Consciousness: awake and sedated  Airway & Oxygen Therapy: Patient Spontanous Breathing, Patient connected to face mask and Patient connected to tracheostomy mask oxygen  Post-op Assessment: Report given to PACU RN and Patient moving all extremities X 4  Post vital signs: Reviewed and stable  Complications: No apparent anesthesia complications

## 2012-08-08 NOTE — Progress Notes (Addendum)
Reported elevated BP and elevated blood sugar of 225  to Dr Okey Dupre and orders received

## 2012-08-08 NOTE — Anesthesia Postprocedure Evaluation (Signed)
  Anesthesia Post-op Note  Patient: Bradley Perez  Procedure(s) Performed: Procedure(s) (LRB): ARTHROSCOPY KNEE (Left)  Patient Location: PACU  Anesthesia Type: General  Level of Consciousness: awake and alert   Airway and Oxygen Therapy: Patient Spontanous Breathing  Post-op Pain: mild  Post-op Assessment: Post-op Vital signs reviewed, Patient's Cardiovascular Status Stable, Respiratory Function Stable, Patent Airway and No signs of Nausea or vomiting  Post-op Vital Signs: stable  Complications: No apparent anesthesia complications

## 2012-08-08 NOTE — Op Note (Signed)
Preoperative diagnosis-  Left knee medial meniscal tear  Postoperative diagnosis Left- knee medial meniscal tear   Procedure- Left knee arthroscopy with medial Meniscal debridement   Surgeon- Gus Rankin. Rachell Druckenmiller, MD  Anesthesia-General  EBL-  minimal Complications- None  Condition- PACU - hemodynamically stable.  Brief clinical note- -Bradley Perez is a 51 y.o.  male with a several month history of left knee pain and mechanical symptoms. Exam and history suggested medial meniscal tear confirmed by MRI. The patient presents now for arthroscopy and debridement   Procedure in detail -       After successful administration of General anesthetic, a tourmiquet is placed high on the Left  thigh and the Left lower extremity is prepped and draped in the usual sterile fashion. Time out is performed by the surgical team. Standard superomedial and inferolateral portal sites are marked and incisions made with an 11 blade. The inflow cannula is passed through the superomedial portal and camera through the inferolateral portal and inflow is initiated. Arthroscopic visualization proceeds.      The undersurface of the patella and trochlea are visualized and there is grade II/III chondromalacia of the trochlea without any unstable lesions. The medial and lateral gutters are visualized and there are several loose bodies which were subsequently removed. Flexion and valgus force is applied to the knee and the medial compartment is entered. A spinal needle is passed into the joint through the site marked for the inferomedial portal. A small incision is made and the dilator passed into the joint. The findings for the medial compartment are tear of body and posterior horn medial meniscus with mild medial femoral condyle chondromalacia . The tear is debrided to a stable base with baskets and a shaver and sealed off with the Arthrocare. The condyle did not need any debridement.    The intercondylar notch is visualized  and the ACL appears normal. The lateral compartment is entered and the findings are normal .      The joint is again inspected and there are no other tears, defects or loose bodies identified. The arthroscopic equipment is then removed from the inferior portals which are closed with interrupted 4-0 nylon. 20 ml of .25% Marcaine with epinephrine are injected through the inflow cannula and the cannula is then removed and the portal closed with nylon. The incisions are cleaned and dried and a bulky sterile dressing is applied. The patient is then awakened and transported to recovery in stable condition.   08/08/2012, 12:44 PM

## 2012-08-08 NOTE — H&P (Signed)
  CC- Bradley Perez is a 51 y.o. male who presents with left knee pain.  HPI- . Knee Pain: Patient presents with knee pain involving the  left knee. Onset of the symptoms was several months ago. Inciting event: felt pop playing basketball. Current symptoms include giving out, pain located medially, popping sensation and stiffness. Pain is aggravated by going up and down stairs, pivoting, rising after sitting, squatting and walking.  Patient has had no prior knee problems. Evaluation to date: MRI: abnormal medial meniscal tear. Treatment to date: avoidance of offending activity and rest.  Past Medical History  Diagnosis Date  . Hypertension   . Bell's palsy   . Diabetes mellitus     Past Surgical History  Procedure Date  . Meniscus repair 2002  . Hernia repair 1995    vental wall    Prior to Admission medications   Medication Sig Start Date End Date Taking? Authorizing Provider  amLODipine (NORVASC) 10 MG tablet Take 10 mg by mouth every morning.    Historical Provider, MD  atorvastatin (LIPITOR) 20 MG tablet Take 20 mg by mouth daily after breakfast.    Historical Provider, MD  Azilsartan-Chlorthalidone (EDARBYCLOR PO) Take 25 mg by mouth every morning.     Historical Provider, MD  ibuprofen (ADVIL,MOTRIN) 200 MG tablet Take 400 mg by mouth every 6 (six) hours as needed. For pain    Historical Provider, MD  losartan-hydrochlorothiazide (HYZAAR) 100-25 MG per tablet Take 1 tablet by mouth daily after breakfast.    Historical Provider, MD  metFORMIN (GLUCOPHAGE) 500 MG tablet Take 500 mg by mouth daily with breakfast.    Historical Provider, MD   KNEE EXAM antalgic gait, soft tissue tenderness over medial joint line, reduced range of motion, negative drawer sign, collateral ligaments intact, normal ipsilateral hip exam  Physical Examination: General appearance - alert, well appearing, and in no distress Mental status - alert, oriented to person, place, and time Chest - clear to  auscultation, no wheezes, rales or rhonchi, symmetric air entry Heart - normal rate, regular rhythm, normal S1, S2, no murmurs, rubs, clicks or gallops Abdomen - soft, nontender, nondistended, no masses or organomegaly Neurological - alert, oriented, normal speech, no focal findings or movement disorder noted   Asessment/Plan--- Left knee medial meniscal tear- - Plan left knee arthroscopy with meniscal debridement. Procedure risks and potential comps discussed with patient who elects to proceed. Goals are decreased pain and increased function with a high likelihood of achieving both

## 2012-08-08 NOTE — Anesthesia Preprocedure Evaluation (Signed)
Anesthesia Evaluation  Patient identified by MRN, date of birth, ID band Patient awake    Reviewed: Allergy & Precautions, H&P , NPO status , Patient's Chart, lab work & pertinent test results  Airway Mallampati: II TM Distance: >3 FB Neck ROM: Full    Dental No notable dental hx.    Pulmonary neg pulmonary ROS,  breath sounds clear to auscultation  Pulmonary exam normal       Cardiovascular hypertension, Pt. on medications and Pt. on home beta blockers negative cardio ROS  Rhythm:Regular Rate:Normal     Neuro/Psych negative neurological ROS  negative psych ROS   GI/Hepatic negative GI ROS, Neg liver ROS,   Endo/Other  diabetes, Type 2, Oral Hypoglycemic Agents  Renal/GU negative Renal ROS  negative genitourinary   Musculoskeletal negative musculoskeletal ROS (+)   Abdominal   Peds negative pediatric ROS (+)  Hematology negative hematology ROS (+)   Anesthesia Other Findings   Reproductive/Obstetrics negative OB ROS                           Anesthesia Physical Anesthesia Plan  ASA: III  Anesthesia Plan: General   Post-op Pain Management:    Induction: Intravenous  Airway Management Planned: LMA  Additional Equipment:   Intra-op Plan:   Post-operative Plan: Extubation in OR  Informed Consent: I have reviewed the patients History and Physical, chart, labs and discussed the procedure including the risks, benefits and alternatives for the proposed anesthesia with the patient or authorized representative who has indicated his/her understanding and acceptance.   Dental advisory given  Plan Discussed with: CRNA  Anesthesia Plan Comments:         Anesthesia Quick Evaluation

## 2012-08-09 ENCOUNTER — Encounter (HOSPITAL_COMMUNITY): Payer: Self-pay | Admitting: Orthopedic Surgery

## 2012-10-02 ENCOUNTER — Telehealth: Payer: Self-pay | Admitting: General Practice

## 2012-10-02 ENCOUNTER — Ambulatory Visit (INDEPENDENT_AMBULATORY_CARE_PROVIDER_SITE_OTHER): Payer: 59 | Admitting: Internal Medicine

## 2012-10-02 ENCOUNTER — Encounter: Payer: Self-pay | Admitting: Internal Medicine

## 2012-10-02 VITALS — BP 140/86 | HR 75 | Temp 98.6°F | Resp 16 | Wt 248.8 lb

## 2012-10-02 DIAGNOSIS — I10 Essential (primary) hypertension: Secondary | ICD-10-CM

## 2012-10-02 DIAGNOSIS — R81 Glycosuria: Secondary | ICD-10-CM

## 2012-10-02 DIAGNOSIS — E878 Other disorders of electrolyte and fluid balance, not elsewhere classified: Secondary | ICD-10-CM

## 2012-10-02 DIAGNOSIS — E1165 Type 2 diabetes mellitus with hyperglycemia: Secondary | ICD-10-CM

## 2012-10-02 DIAGNOSIS — E876 Hypokalemia: Secondary | ICD-10-CM

## 2012-10-02 DIAGNOSIS — E785 Hyperlipidemia, unspecified: Secondary | ICD-10-CM | POA: Insufficient documentation

## 2012-10-02 DIAGNOSIS — E119 Type 2 diabetes mellitus without complications: Secondary | ICD-10-CM

## 2012-10-02 LAB — POCT URINALYSIS DIPSTICK
Ketones, UA: NEGATIVE
Leukocytes, UA: NEGATIVE
Spec Grav, UA: 1.02
Urobilinogen, UA: 0.2
pH, UA: 7

## 2012-10-02 LAB — COMPREHENSIVE METABOLIC PANEL
CO2: 31 mEq/L (ref 19–32)
Creatinine, Ser: 1.4 mg/dL (ref 0.4–1.5)
GFR: 69.78 mL/min (ref >60.00)
Glucose, Bld: 168 mg/dL — ABNORMAL HIGH (ref 70–99)
Total Bilirubin: 1 mg/dL (ref 0.3–1.2)

## 2012-10-02 LAB — LIPID PANEL
Cholesterol: 225 mg/dL — ABNORMAL HIGH (ref 0–200)
HDL: 38.1 mg/dL — ABNORMAL LOW (ref >39.00)
VLDL: 30.4 mg/dL (ref 0.0–40.0)

## 2012-10-02 MED ORDER — LOSARTAN POTASSIUM-HCTZ 100-25 MG PO TABS: 1.0000 | ORAL_TABLET | Freq: Every day | ORAL | Status: DC

## 2012-10-02 MED ORDER — ATORVASTATIN CALCIUM 20 MG PO TABS: 40.0000 mg | ORAL_TABLET | Freq: Every day | ORAL | Status: DC

## 2012-10-02 MED ORDER — GLIPIZIDE 5 MG PO TABS: 5.0000 mg | ORAL_TABLET | Freq: Two times a day (BID) | ORAL | Status: DC

## 2012-10-02 MED ORDER — POTASSIUM CHLORIDE CRYS ER 20 MEQ PO TBCR: EXTENDED_RELEASE_TABLET | ORAL | Status: DC

## 2012-10-02 MED ORDER — ATORVASTATIN CALCIUM 40 MG PO TABS: 40.0000 mg | ORAL_TABLET | Freq: Every day | ORAL | Status: DC

## 2012-10-02 MED ORDER — AMLODIPINE BESYLATE 10 MG PO TABS: 10.0000 mg | ORAL_TABLET | Freq: Every morning | ORAL | Status: DC

## 2012-10-02 NOTE — Progress Notes (Signed)
Subjective:    Patient ID: Bradley Perez, male    DOB: 04-Oct-1961, 51 y.o.   MRN: [id number]  HPI 51YO male with h/o HTN, DM, HL presents for follow up. Has been lost to follow up for 6 months. Was caring for wife who was ill and hospitalized. Has not been regularly checking blood sugars or taking medications.  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  Reports he has generally been feeling well. Has been walking 2 miles daily. Symptoms of knee pain improving after surgery. No new concerns today.  Outpatient Encounter Prescriptions as of 10/02/2012  Medication Sig Dispense Refill  . amLODipine (NORVASC) 10 MG tablet Take 1 tablet (10 mg total) by mouth every morning.  90 tablet  4  . atorvastatin (LIPITOR) 20 MG tablet Take 2 tablets (40 mg total) by mouth daily after breakfast.  30 tablet  6  . ibuprofen (ADVIL,MOTRIN) 200 MG tablet Take 400 mg by mouth every 6 (six) hours as needed. For pain      . losartan-hydrochlorothiazide (HYZAAR) 100-25 MG per tablet Take 1 tablet by mouth daily after breakfast.  90 tablet  4  . metFORMIN (GLUCOPHAGE) 500 MG tablet Take 500 mg by mouth daily with breakfast.      . [DISCONTINUED] amLODipine (NORVASC) 10 MG tablet Take 10 mg by mouth every morning.      . [DISCONTINUED] atorvastatin (LIPITOR) 20 MG tablet Take 20 mg by mouth daily after breakfast.      . [DISCONTINUED] losartan-hydrochlorothiazide (HYZAAR) 100-25 MG per tablet Take 1 tablet by mouth daily after breakfast.      . Azilsartan-Chlorthalidone (EDARBYCLOR PO) Take 25 mg by mouth every morning.       Marland Kitchen glipiZIDE (GLUCOTROL) 5 MG tablet Take 1 tablet (5 mg total) by mouth 2 (two) times daily before a meal.  60 tablet  3  . methocarbamol (ROBAXIN) 500 MG tablet Take 1 tablet (500 mg total) by mouth 4 (four) times daily.  30 tablet  1  . oxyCODONE-acetaminophen (ROXICET) 5-325 MG per tablet Take 1-2 tablets by mouth every 4 (four) hours as needed for pain.  40 tablet  0   BP  140/86  Pulse 75  Temp 98.6 F (37 C) (Oral)  Resp 16  Wt 248 lb 12 oz (112.832 kg)  Review of Systems  Constitutional: Negative for fever, chills, activity change, appetite change, fatigue and unexpected weight change.  Eyes: Negative for visual disturbance.  Respiratory: Negative for cough and shortness of breath.   Cardiovascular: Negative for chest pain, palpitations and leg swelling.  Gastrointestinal: Negative for abdominal pain and abdominal distention.  Genitourinary: Negative for dysuria, urgency and difficulty urinating.  Musculoskeletal: Negative for arthralgias and gait problem.  Skin: Negative for color change and rash.  Hematological: Negative for adenopathy.  Psychiatric/Behavioral: Negative for sleep disturbance and dysphoric mood. The patient is not nervous/anxious.        Objective:   Physical Exam  Constitutional: He is oriented to person, place, and time. He appears well-developed and well-nourished. No distress.  HENT:  Head: Normocephalic and atraumatic.  Right Ear: External ear normal.  Left Ear: External ear normal.  Nose: Nose normal.  Mouth/Throat: Oropharynx is clear and moist. No oropharyngeal exudate.  Eyes: Conjunctivae normal and EOM are normal. Pupils are equal, round, and reactive to light. Right eye exhibits no discharge. Left eye exhibits no discharge. No scleral icterus.  Neck: Normal range of motion. Neck supple. No tracheal deviation  present. No thyromegaly present.  Cardiovascular: Normal rate, regular rhythm and normal heart sounds.  Exam reveals no gallop and no friction rub.   No murmur heard. Pulmonary/Chest: Effort normal and breath sounds normal. No respiratory distress. He has no wheezes. He has no rales. He exhibits no tenderness.  Musculoskeletal: Normal range of motion. He exhibits no edema.  Lymphadenopathy:    He has no cervical adenopathy.  Neurological: He is alert and oriented to person, place, and time. No cranial nerve  deficit. Coordination normal.  Skin: Skin is warm and dry. No rash noted. He is not diaphoretic. No erythema. No pallor.  Psychiatric: He has a normal mood and affect. His behavior is normal. Judgment and thought content normal.          Assessment & Plan:

## 2012-10-02 NOTE — Assessment & Plan Note (Signed)
Potassium noted to be low on labs today with K 2.4.  Will supplement with KCl bid x 2 days then daily. Repeat BMP next week.

## 2012-10-02 NOTE — Telephone Encounter (Signed)
Pt notified of potassium of 2.4.  In reviewing, he has a history of low potassium.  I confirmed he is not taking any potassium now.  Please call in kcl .  Take  bid x 2 days and then 40 meq q day.  Recheck potassium (276.9) in one week.  Dr Dan Humphreys can then follow up regarding how much potassium to remain on after that.

## 2012-10-02 NOTE — Patient Instructions (Signed)
Please check sugars 2-3 times per week. Goal fasting 80-120, goal 1hr after a meal <200. Please email results to our office.

## 2012-10-02 NOTE — Telephone Encounter (Signed)
Med ordered. Would not let me put in 40 mEq had to order 20 mEq. Order is placed for potassium recheck.

## 2012-10-02 NOTE — Telephone Encounter (Signed)
Message was routed to Dr. Lorin Picket due to Dr. Rosie Fate being out of office. Received a call from Darl Pikes at [facility] stating that pt has a critical potassium level 2.4. Please advise.

## 2012-10-02 NOTE — Assessment & Plan Note (Signed)
A1c elevated at 9%.  Will add glipizide to metformin. Encouraged better compliance with medications and monitor of BG.  Pt will email with BG readings. Follow up in 1 month.

## 2012-10-02 NOTE — Assessment & Plan Note (Signed)
BP is improved, but still elevated compared to previous. Will continue current medications for now. Encouraged continued efforts at exercise and improved diet. Encouraged better compliance with follow up. RTC for recheck 1 month.

## 2012-10-02 NOTE — Assessment & Plan Note (Signed)
LDL above goal at 167. Will increase lipitor to 40mg  daily and recheck lipids and lfts in 1 month.

## 2012-10-09 ENCOUNTER — Other Ambulatory Visit (INDEPENDENT_AMBULATORY_CARE_PROVIDER_SITE_OTHER): Payer: 59

## 2012-10-09 ENCOUNTER — Telehealth: Payer: Self-pay | Admitting: General Practice

## 2012-10-09 DIAGNOSIS — E876 Hypokalemia: Secondary | ICD-10-CM

## 2012-10-09 DIAGNOSIS — E878 Other disorders of electrolyte and fluid balance, not elsewhere classified: Secondary | ICD-10-CM

## 2012-10-09 LAB — BASIC METABOLIC PANEL
BUN: 18 mg/dL (ref 6–23)
CO2: 32 mEq/L (ref 19–32)
Calcium: 9 mg/dL (ref 8.4–10.5)
Creatinine, Ser: 1.3 mg/dL (ref 0.4–1.5)
GFR: 72.19 mL/min (ref >60.00)
Glucose, Bld: 114 mg/dL — ABNORMAL HIGH (ref 70–99)
Sodium: 137 mEq/L (ref 135–145)

## 2012-10-09 NOTE — Telephone Encounter (Signed)
Yes, he should start original Rx for Potassium, then repeat K level on Friday morning.

## 2012-10-09 NOTE — Telephone Encounter (Signed)
Need to confirm what dose of potassium he is taking now.  Make sure compliant.  If not taking, then bid x 2 days and then continue q day.  If taking 40 meq daily, then increase to bid and schedule an appt to discuss further w/up and repeat lab - within the next few days - if possible.

## 2012-10-09 NOTE — Telephone Encounter (Signed)
Called Pt wife advised that Potassium was never picked up last week. Pt wife advised that they will be picking it up tomorrow.

## 2012-10-09 NOTE — Telephone Encounter (Signed)
Pt has a critical Potassium level of 2.5. Routed to Dr. Lorin Picket per Dr. Dan Humphreys being out of office.

## 2012-10-10 NOTE — Telephone Encounter (Signed)
Pt.notified

## 2012-10-25 ENCOUNTER — Other Ambulatory Visit: Payer: Self-pay | Admitting: Internal Medicine

## 2012-10-25 MED ORDER — METFORMIN HCL 500 MG PO TABS: 500.0000 mg | ORAL_TABLET | Freq: Every day | ORAL | Status: DC

## 2012-10-25 NOTE — Telephone Encounter (Signed)
Med filled.  

## 2012-10-25 NOTE — Telephone Encounter (Signed)
Metformin HCL 500 mg tab  Take one tablet by mouth two times daily  # 60

## 2012-10-29 ENCOUNTER — Telehealth: Payer: Self-pay | Admitting: Internal Medicine

## 2012-10-29 ENCOUNTER — Ambulatory Visit: Payer: Self-pay | Admitting: Internal Medicine

## 2012-10-29 NOTE — Telephone Encounter (Signed)
Please remind pt that he needs to have potassium rechecked. He came late and rescheduled his appointment today.

## 2012-10-30 NOTE — Telephone Encounter (Signed)
Advised patient that he still needs to come in for potassium check.  He states he has another appt to see you scheduled for 11/13 and is asking if he can have the lab then, states he cant come in before then because he is working.  Please advise.

## 2012-10-30 NOTE — Telephone Encounter (Signed)
Pt states he has been taking potassium everyday for about 2 weeks.  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  I told him that someone will call him back next week to see what his schedule is then.  Stressed the importance of him following up with potassium.  He states he understands.

## 2012-10-30 NOTE — Telephone Encounter (Signed)
It is absolutely necessary that he takes the potassium supplement as we directed, then follows up with the lab, ideally this week or next. This is not a fasting lab, so he can come by anytime.

## 2012-11-19 ENCOUNTER — Encounter: Payer: Self-pay | Admitting: Internal Medicine

## 2012-11-19 ENCOUNTER — Telehealth: Payer: Self-pay | Admitting: *Deleted

## 2012-11-19 ENCOUNTER — Ambulatory Visit (INDEPENDENT_AMBULATORY_CARE_PROVIDER_SITE_OTHER): Payer: 59 | Admitting: Internal Medicine

## 2012-11-19 VITALS — BP 142/90 | HR 68 | Temp 98.5°F | Ht 70.5 in | Wt 252.5 lb

## 2012-11-19 DIAGNOSIS — E1165 Type 2 diabetes mellitus with hyperglycemia: Secondary | ICD-10-CM

## 2012-11-19 DIAGNOSIS — E876 Hypokalemia: Secondary | ICD-10-CM

## 2012-11-19 DIAGNOSIS — I10 Essential (primary) hypertension: Secondary | ICD-10-CM

## 2012-11-19 MED ORDER — GLIPIZIDE 5 MG PO TABS: 5.0000 mg | ORAL_TABLET | Freq: Two times a day (BID) | ORAL | Status: DC

## 2012-11-19 MED ORDER — AMLODIPINE BESYLATE 10 MG PO TABS: 10.0000 mg | ORAL_TABLET | Freq: Every morning | ORAL | Status: AC

## 2012-11-19 MED ORDER — LOSARTAN POTASSIUM 100 MG PO TABS: 100.0000 mg | ORAL_TABLET | Freq: Every day | ORAL | Status: DC

## 2012-11-19 NOTE — Telephone Encounter (Signed)
CMP 401.9 

## 2012-11-19 NOTE — Telephone Encounter (Signed)
What labs and dx did you want for this pt?

## 2012-11-19 NOTE — Patient Instructions (Signed)
Stop Losartan-HCTZ and start plain Losartan.  Stop Metformin and continue Glipizide. Monitor sugars carefully 1-2 times per day if possible. Email with blood sugar readings.  Repeat labs next week.

## 2012-11-19 NOTE — Assessment & Plan Note (Signed)
BP has been fairly well controlled with Loxartan-HCTZ and amlodipine, however persistent hypokalemia with HCTZ. Will stop HCTZ and continue Losartan alone. Will monitor BP. If elevated, will plan to add betablocker. Follow up 4 weeks.

## 2012-11-19 NOTE — Assessment & Plan Note (Signed)
Likely secondary to use of HCTZ. Will stop HCTZ. Will recheck K today and plan to repeat BMP in 3 weeks with A1c.

## 2012-11-19 NOTE — Progress Notes (Signed)
Subjective:    Patient ID: Bradley Perez, male    DOB: [birth year], 52 y.o.   MRN: [id number]  HPI 52 year old male with history of diabetes, hypertension presents for followup. He reports he is generally feeling well. In regards to diabetes, he reports most fasting blood sugars near 100. He reports compliance with this medication however notes persistent and severe watery diarrhea with metformin. He questions whether there may be an alternative medication with fewer side effects. In regards to hypertension, he reports compliance with blood pressure medications. He denies any headache, chest pain, palpitations. He reports he has been taking potassium supplements as instructed for hypokalemia.  Outpatient Encounter Prescriptions as of 11/19/2012  Medication Sig Dispense Refill  . amLODipine (NORVASC) 10 MG tablet Take 1 tablet (10 mg total) by mouth every morning.  90 tablet  3  . atorvastatin (LIPITOR) 40 MG tablet Take 1 tablet (40 mg total) by mouth daily after breakfast.  30 tablet  6  . glipiZIDE (GLUCOTROL) 5 MG tablet Take 1 tablet (5 mg total) by mouth 2 (two) times daily before a meal.  180 tablet  3  . ibuprofen (ADVIL,MOTRIN) 200 MG tablet Take 400 mg by mouth every 6 (six) hours as needed. For pain      . methocarbamol (ROBAXIN) 500 MG tablet Take 1 tablet (500 mg total) by mouth 4 (four) times daily.  30 tablet  1  . oxyCODONE-acetaminophen (ROXICET) 5-325 MG per tablet Take 1-2 tablets by mouth every 4 (four) hours as needed for pain.  40 tablet  0   BP 142/90  Pulse 68  Temp 98.5 F (36.9 C) (Oral)  Ht 5' 10.5" (1.791 m)  Wt 252 lb 8 oz (114.533 kg)  BMI 35.72 kg/m2  SpO2 96%  Review of Systems  Constitutional: Negative for fever, chills, activity change, appetite change, fatigue and unexpected weight change.  Eyes: Negative for visual disturbance.  Respiratory: Negative for cough and shortness of breath.   Cardiovascular: Negative for chest pain, palpitations and leg  swelling.  Gastrointestinal: Negative for abdominal pain and abdominal distention.  Genitourinary: Negative for dysuria, urgency and difficulty urinating.  Musculoskeletal: Negative for arthralgias and gait problem.  Skin: Negative for color change and rash.  Hematological: Negative for adenopathy.  Psychiatric/Behavioral: Negative for sleep disturbance and dysphoric mood. The patient is not nervous/anxious.        Objective:   Physical Exam  Constitutional: He is oriented to person, place, and time. He appears well-developed and well-nourished. No distress.  HENT:  Head: Normocephalic and atraumatic.  Right Ear: External ear normal.  Left Ear: External ear normal.  Nose: Nose normal.  Mouth/Throat: Oropharynx is clear and moist. No oropharyngeal exudate.  Eyes: Conjunctivae normal and EOM are normal. Pupils are equal, round, and reactive to light. Right eye exhibits no discharge. Left eye exhibits no discharge. No scleral icterus.  Neck: Normal range of motion. Neck supple. No tracheal deviation present. No thyromegaly present.  Cardiovascular: Normal rate, regular rhythm and normal heart sounds.  Exam reveals no gallop and no friction rub.   No murmur heard. Pulmonary/Chest: Effort normal and breath sounds normal. No respiratory distress. He has no wheezes. He has no rales. He exhibits no tenderness.  Musculoskeletal: Normal range of motion. He exhibits no edema.  Lymphadenopathy:    He has no cervical adenopathy.  Neurological: He is alert and oriented to person, place, and time. No cranial nerve deficit. Coordination normal.  Skin: Skin is warm and dry. No rash  noted. He is not diaphoretic. No erythema. No pallor.  Psychiatric: He has a normal mood and affect. His behavior is normal. Judgment and thought content normal.          Assessment & Plan:

## 2012-11-19 NOTE — Assessment & Plan Note (Signed)
Pt reports better control of blood sugars. 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 Will stop metformin as causing pt considerable diarrhea daily. Will continue Glipizide. Discussed that we may need to increase dose. He will email with BG readings.

## 2012-11-20 LAB — COMPREHENSIVE METABOLIC PANEL
ALT: 31 U/L (ref 0–53)
AST: 30 U/L (ref 0–37)
CO2: 35 mEq/L — ABNORMAL HIGH (ref 19–32)
Calcium: 9.7 mg/dL (ref 8.4–10.5)
Chloride: 100 mEq/L (ref 96–112)
GFR: 62.39 mL/min (ref >60.00)
Sodium: 140 mEq/L (ref 135–145)
Total Protein: 7.5 g/dL (ref 6.0–8.3)

## 2012-12-17 ENCOUNTER — Ambulatory Visit: Payer: Self-pay | Admitting: Internal Medicine

## 2012-12-22 ENCOUNTER — Other Ambulatory Visit: Payer: Self-pay

## 2012-12-26 ENCOUNTER — Ambulatory Visit (INDEPENDENT_AMBULATORY_CARE_PROVIDER_SITE_OTHER): Payer: 59 | Admitting: Internal Medicine

## 2012-12-26 ENCOUNTER — Encounter: Payer: Self-pay | Admitting: Internal Medicine

## 2012-12-26 VITALS — BP 162/88 | HR 68 | Temp 98.5°F | Wt 250.0 lb

## 2012-12-26 DIAGNOSIS — E785 Hyperlipidemia, unspecified: Secondary | ICD-10-CM

## 2012-12-26 DIAGNOSIS — I10 Essential (primary) hypertension: Secondary | ICD-10-CM

## 2012-12-26 NOTE — Assessment & Plan Note (Signed)
Will recheck lipids with labs today. Continue Atorvastatin. 

## 2012-12-26 NOTE — Assessment & Plan Note (Signed)
BP Readings from Last 3 Encounters:  12/26/12 162/88  11/19/12 142/90  10/02/12 140/86   BP elevated today, but pt did not take medications. Encouraged compliance with meds. Will have him monitor BP at home and call if persistently >140/90. Follow up in 1 month to recheck BP.

## 2012-12-26 NOTE — Progress Notes (Signed)
Subjective:    Patient ID: Bradley Perez, male    DOB: 1961-04-20, 52 y.o.   MRN: [id number]  HPI 51YO male with DM, and HTN presents for follow up. Doing well. BG well controlled per report, did not bring record of BG today, but denies any BG>200. Compliant with glipizide.  In regards to BP, notes he did not take BP meds this morning, however has generally been compliant with medication. Denies chest pain, headache, palpitations.  Outpatient Encounter Prescriptions as of 12/26/2012  Medication Sig Dispense Refill  . amLODipine (NORVASC) 10 MG tablet Take 1 tablet (10 mg total) by mouth every morning.  90 tablet  3  . glipiZIDE (GLUCOTROL) 5 MG tablet Take 1 tablet (5 mg total) by mouth 2 (two) times daily before a meal.  180 tablet  3  . losartan (COZAAR) 100 MG tablet Take 1 tablet (100 mg total) by mouth daily.  90 tablet  3  . [DISCONTINUED] potassium chloride SA (K-DUR,KLOR-CON) 20 MEQ tablet Take two ( ) tablets by mouth twice a day for 2 days. Then take take two ( ) tablets by mouth for 4 days.  16 tablet  0  . atorvastatin (LIPITOR) 40 MG tablet Take 1 tablet (40 mg total) by mouth daily after breakfast.  30 tablet  6  . ibuprofen (ADVIL,MOTRIN) 200 MG tablet Take 400 mg by mouth every 6 (six) hours as needed. For pain      . methocarbamol (ROBAXIN) 500 MG tablet Take 1 tablet (500 mg total) by mouth 4 (four) times daily.  30 tablet  1  . oxyCODONE-acetaminophen (ROXICET) 5-325 MG per tablet Take 1-2 tablets by mouth every 4 (four) hours as needed for pain.  40 tablet  0   No facility-administered encounter medications on file as of 12/26/2012.   BP 162/88  Pulse 68  Temp(Src) 98.5 F (36.9 C) (Oral)  Wt 250 lb (113.399 kg)  BMI 35.35 kg/m2  SpO2 96%  Review of Systems  Constitutional: Negative for fever, chills, activity change, appetite change, fatigue and unexpected weight change.  Eyes: Negative for visual disturbance.  Respiratory: Negative for cough and shortness  of breath.   Cardiovascular: Negative for chest pain, palpitations and leg swelling.  Gastrointestinal: Negative for abdominal pain and abdominal distention.  Genitourinary: Negative for dysuria, urgency and difficulty urinating.  Musculoskeletal: Negative for arthralgias and gait problem.  Skin: Negative for color change and rash.  Hematological: Negative for adenopathy.  Psychiatric/Behavioral: Negative for sleep disturbance and dysphoric mood. The patient is not nervous/anxious.        Objective:   Physical Exam  Constitutional: He is oriented to person, place, and time. He appears well-developed and well-nourished. No distress.  HENT:  Head: Normocephalic and atraumatic.  Right Ear: External ear normal.  Left Ear: External ear normal.  Nose: Nose normal.  Mouth/Throat: Oropharynx is clear and moist. No oropharyngeal exudate.  Eyes: Conjunctivae and EOM are normal. Pupils are equal, round, and reactive to light. Right eye exhibits no discharge. Left eye exhibits no discharge. No scleral icterus.  Neck: Normal range of motion. Neck supple. No tracheal deviation present. No thyromegaly present.  Cardiovascular: Normal rate, regular rhythm and normal heart sounds.  Exam reveals no gallop and no friction rub.   No murmur heard. Pulmonary/Chest: Effort normal and breath sounds normal. No respiratory distress. He has no wheezes. He has no rales. He exhibits no tenderness.  Musculoskeletal: Normal range of motion. He exhibits no edema.  Lymphadenopathy:    He  has no cervical adenopathy.  Neurological: He is alert and oriented to person, place, and time. No cranial nerve deficit. Coordination normal.  Skin: Skin is warm and dry. No rash noted. He is not diaphoretic. No erythema. No pallor.  Psychiatric: He has a normal mood and affect. His behavior is normal. Judgment and thought content normal.          Assessment & Plan:

## 2012-12-26 NOTE — Assessment & Plan Note (Signed)
Pt reports good control of BG. 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

## 2012-12-27 ENCOUNTER — Telehealth: Payer: Self-pay | Admitting: Internal Medicine

## 2012-12-27 ENCOUNTER — Other Ambulatory Visit: Payer: Self-pay | Admitting: Internal Medicine

## 2012-12-27 DIAGNOSIS — E876 Hypokalemia: Secondary | ICD-10-CM

## 2012-12-27 LAB — COMPREHENSIVE METABOLIC PANEL
ALT: 40 U/L (ref 0–53)
Albumin: 4.1 g/dL (ref 3.5–5.2)
Alkaline Phosphatase: 74 U/L (ref 39–117)
CO2: 30 mEq/L (ref 19–32)
Glucose, Bld: 149 mg/dL — ABNORMAL HIGH (ref 70–99)
Potassium: 2.9 mEq/L — ABNORMAL LOW (ref 3.5–5.1)
Sodium: 140 mEq/L (ref 135–145)
Total Bilirubin: 0.8 mg/dL (ref 0.3–1.2)
Total Protein: 7.7 g/dL (ref 6.0–8.3)

## 2012-12-27 LAB — LIPID PANEL
HDL: 38.6 mg/dL — ABNORMAL LOW (ref >39.00)
VLDL: 30.4 mg/dL (ref 0.0–40.0)

## 2012-12-27 LAB — LDL CHOLESTEROL, DIRECT: Direct LDL: 133.6 mg/dL

## 2012-12-27 LAB — HEMOGLOBIN A1C: Hgb A1c MFr Bld: 6.4 % (ref 4.6–6.5)

## 2012-12-27 MED ORDER — POTASSIUM CHLORIDE ER 10 MEQ PO TBCR
10.0000 meq | EXTENDED_RELEASE_TABLET | Freq: Two times a day (BID) | ORAL | Status: DC
Start: 2012-12-27 — End: 2018-06-07

## 2012-12-27 NOTE — Telephone Encounter (Signed)
Information has been faxed  

## 2012-12-27 NOTE — Telephone Encounter (Signed)
Pt spouse called stated Bradley Perez labs (a1c) needed to be faxed to his work today so he can go back to work tomorrow ATTN:  [facility] Monday Fax # [phone]

## 2013-01-28 ENCOUNTER — Ambulatory Visit: Payer: 59 | Admitting: Internal Medicine

## 2013-01-28 ENCOUNTER — Telehealth: Payer: Self-pay | Admitting: Internal Medicine

## 2013-01-28 NOTE — Telephone Encounter (Signed)
Patient wife has been informed of this.

## 2013-01-28 NOTE — Telephone Encounter (Signed)
At this visit, he was late for his appointment. The next appointment was a new patient, and I asked the nurse to let him know that the new pt was being seen and he might have 30-18min wait. He was given the option to wait or reschedule. If he is unhappy, then he may want to find another primary care provider. He does not need a referral for this.

## 2013-01-28 NOTE — Telephone Encounter (Signed)
Spoke with patient wife, she state this is the second time that he has been on time for his appointment and was not seen. Patient arrived for his 15 minute 2:45 appointment at 2:53 and was given a choice to wait or reschedule his appointment. I spoke with the patient and I informed him of this and also told him that since he was not here at his appointment time I called back the next patient and when I called her back it was after 2:45 and I did not see this pateint in the lobby at all. We would see him if he was willing to wait or he had the option to reschedule. Patient replied he was going to sit and think about it. The same information was given to his wife and she was not happy.

## 2013-01-28 NOTE — Telephone Encounter (Signed)
Pt spouse calling asking for a referral to another primary care physician.  Pt would like a call back.

## 2013-03-08 ENCOUNTER — Other Ambulatory Visit: Payer: Self-pay | Admitting: Orthopaedic Surgery

## 2013-03-08 DIAGNOSIS — M545 Low back pain: Secondary | ICD-10-CM

## 2013-03-11 ENCOUNTER — Ambulatory Visit: Payer: 59 | Admitting: Internal Medicine

## 2013-09-12 ENCOUNTER — Other Ambulatory Visit: Payer: Self-pay

## 2014-08-22 ENCOUNTER — Other Ambulatory Visit: Payer: Self-pay

## 2016-10-13 ENCOUNTER — Encounter (HOSPITAL_COMMUNITY): Payer: Self-pay | Admitting: Emergency Medicine

## 2016-10-13 ENCOUNTER — Emergency Department (HOSPITAL_COMMUNITY)
Admission: EM | Admit: 2016-10-13 | Discharge: 2016-10-13 | Disposition: A | Discharge status: Home / Self Care | Payer: 59 | Attending: Dermatology | Admitting: Dermatology

## 2016-10-13 DIAGNOSIS — Z5321 Procedure and treatment not carried out due to patient leaving prior to being seen by health care provider: Secondary | ICD-10-CM | POA: Insufficient documentation

## 2016-10-13 DIAGNOSIS — R29810 Facial weakness: Secondary | ICD-10-CM | POA: Insufficient documentation

## 2016-10-13 DIAGNOSIS — I10 Essential (primary) hypertension: Secondary | ICD-10-CM | POA: Diagnosis not present

## 2016-10-13 DIAGNOSIS — Z7984 Long term (current) use of oral hypoglycemic drugs: Secondary | ICD-10-CM | POA: Diagnosis not present

## 2016-10-13 DIAGNOSIS — E119 Type 2 diabetes mellitus without complications: Secondary | ICD-10-CM | POA: Insufficient documentation

## 2016-10-13 NOTE — ED Triage Notes (Signed)
Pt c/o loss of taste to right tongue, headache, tried to drink and water dripped out of mouth, onset yesterday. On waking pt noticed right face was drooping, last seen normal for face was last night at 2300. Upper, middle, and lower regions of face all weak and drooped. Sensation intact. CN II-IV and VI-XII intact.  Pt had hx of Bell's palsy when taking Lisinopril in past. Has recently doubled lisinopril dosage.

## 2016-10-13 NOTE — ED Notes (Signed)
Called for patient x 2.  Pt not found in lobby.

## 2016-10-13 NOTE — ED Notes (Signed)
Discussed pt's symptoms of unilateral facial droop involving top, middle, and lower face along with right side loss of taste sensation last normal yesterday at 2300 with Eulis Foster, MD. Discussed whether to order triage protocol orders for CVA versus wait for ED provider to evaluate patient. Eulis Foster, MD, advises to wait for provider evaluation.

## 2017-03-03 ENCOUNTER — Encounter: Payer: Self-pay | Admitting: *Deleted

## 2017-03-28 ENCOUNTER — Encounter: Payer: Self-pay | Admitting: Internal Medicine

## 2018-06-13 ENCOUNTER — Encounter (HOSPITAL_COMMUNITY): Payer: Self-pay

## 2018-06-13 NOTE — Patient Instructions (Signed)
Your procedure is scheduled on: Thursday, Aug. 15, 2019   Surgery Time:  9:00AM-10:30AM   Report to [facility]  Entrance    Report to admitting at 6:30 AM   Call this number if you have problems the morning of surgery [phone]   Do not eat food or drink liquids :After Midnight.   Do NOT smoke after Midnight   Take these medicines the morning of surgery with A SIP OF WATER: Amlodipine, Carvedilol, Hydralazine   DO NOT TAKE ANY DIABETIC MEDICATIONS DAY OF YOUR SURGERY                               You may not have any metal on your body including jewelry, and body piercings             Do not wear lotions, powders, perfumes/cologne, or deodorant                          Men may shave face and neck.   Do not bring valuables to the hospital. [place].   Contacts, dentures or bridgework may not be worn into surgery.   Leave suitcase in the car. After surgery it may be brought to your room.   Special Instructions: Bring a copy of your healthcare power of attorney and living will documents         the day of surgery if you haven't scanned them in before.              Please read over the following fact sheets you were given:  [facility] - Preparing for Surgery Before surgery, you can play an important role.  Because skin is not sterile, your skin needs to be as free of germs as possible.  You can reduce the number of germs on your skin by washing with CHG (chlorahexidine gluconate) soap before surgery.  CHG is an antiseptic cleaner which kills germs and bonds with the skin to continue killing germs even after washing. Please DO NOT use if you have an allergy to CHG or antibacterial soaps.  If your skin becomes reddened/irritated stop using the CHG and inform your nurse when you arrive at Short Stay. Do not shave (including legs and underarms) for at least 48 hours prior to the first CHG shower.  You may shave  your face/neck.  Please follow these instructions carefully:  1.  Shower with CHG Soap the night before surgery and the  morning of surgery.  2.  If you choose to wash your hair, wash your hair first as usual with your normal  shampoo.  3.  After you shampoo, rinse your hair and body thoroughly to remove the shampoo.                             4.  Use CHG as you would any other liquid soap.  You can apply chg directly to the skin and wash.  Gently with a scrungie or clean washcloth.  5.  Apply the CHG Soap to your body ONLY FROM THE NECK DOWN.   Do   not use on face/ open  Wound or open sores. Avoid contact with eyes, ears mouth and   genitals (private parts).                       Wash face,  Genitals (private parts) with your normal soap.             6.  Wash thoroughly, paying special attention to the area where your    surgery  will be performed.  7.  Thoroughly rinse your body with warm water from the neck down.  8.  DO NOT shower/wash with your normal soap after using and rinsing off the CHG Soap.                9.  Pat yourself dry with a clean towel.            10.  Wear clean pajamas.            11.  Place clean sheets on your bed the night of your first shower and do not  sleep with pets. Day of Surgery : Do not apply any lotions/deodorants the morning of surgery.  Please wear clean clothes to the hospital/surgery center.  FAILURE TO FOLLOW THESE INSTRUCTIONS MAY RESULT IN THE CANCELLATION OF YOUR SURGERY  PATIENT SIGNATURE_________________________________  NURSE SIGNATURE__________________________________  ________________________________________________________________________   Bradley Perez  An incentive spirometer is a tool that can help keep your lungs clear and active. This tool measures how well you are filling your lungs with each breath. Taking long deep breaths may help reverse or decrease the chance of developing breathing  (pulmonary) problems (especially infection) following:  A long period of time when you are unable to move or be active. BEFORE THE PROCEDURE   If the spirometer includes an indicator to show your best effort, your nurse or respiratory therapist will set it to a desired goal.  If possible, sit up straight or lean slightly forward. Try not to slouch.  Hold the incentive spirometer in an upright position. INSTRUCTIONS FOR USE  1. Sit on the edge of your bed if possible, or sit up as far as you can in bed or on a chair. 2. Hold the incentive spirometer in an upright position. 3. Breathe out normally. 4. Place the mouthpiece in your mouth and seal your lips tightly around it. 5. Breathe in slowly and as deeply as possible, raising the piston or the ball toward the top of the column. 6. Hold your breath for 3-5 seconds or for as long as possible. Allow the piston or ball to fall to the bottom of the column. 7. Remove the mouthpiece from your mouth and breathe out normally. 8. Rest for a few seconds and repeat Steps 1 through 7 at least 10 times every 1-2 hours when you are awake. Take your time and take a few normal breaths between deep breaths. 9. The spirometer may include an indicator to show your best effort. Use the indicator as a goal to work toward during each repetition. 10. After each set of 10 deep breaths, practice coughing to be sure your lungs are clear. If you have an incision (the cut made at the time of surgery), support your incision when coughing by placing a pillow or rolled up towels firmly against it. Once you are able to get out of bed, walk around indoors and cough well. You may stop using the incentive spirometer when instructed by your caregiver.  RISKS AND COMPLICATIONS  Take your time  so you do not get dizzy or light-headed.  If you are in pain, you may need to take or ask for pain medication before doing incentive spirometry. It is harder to take a deep breath if you  are having pain. AFTER USE  Rest and breathe slowly and easily.  It can be helpful to keep track of a log of your progress. Your caregiver can provide you with a simple table to help with this. If you are using the spirometer at home, follow these instructions: [place] IF:   You are having difficultly using the spirometer.  You have trouble using the spirometer as often as instructed.  Your pain medication is not giving enough relief while using the spirometer.  You develop fever of 100.5 F (38.1 C) or higher. SEEK IMMEDIATE MEDICAL CARE IF:   You cough up bloody sputum that had not been present before.  You develop fever of 102 F (38.9 C) or greater.  You develop worsening pain at or near the incision site. MAKE SURE YOU:   Understand these instructions.  Will watch your condition.  Will get help right away if you are not doing well or get worse. Document Released: 03/06/2007 Document Revised: 01/16/2012 Document Reviewed: 05/07/2007 ExitCare Patient Information 2014 [employer], [place].   ________________________________________________________________________  WHAT IS A BLOOD TRANSFUSION? Blood Transfusion Information  A transfusion is the replacement of blood or some of its parts. Blood is made up of multiple cells which provide different functions.  Red blood cells carry oxygen and are used for blood loss replacement.  White blood cells fight against infection.  Platelets control bleeding.  Plasma helps clot blood.  Other blood products are available for specialized needs, such as hemophilia or other clotting disorders. BEFORE THE TRANSFUSION  Who gives blood for transfusions?   Healthy volunteers who are fully evaluated to make sure their blood is safe. This is blood bank blood. Transfusion therapy is the safest it has ever been in the practice of medicine. Before blood is taken from a donor, a complete history is taken to make sure that person has  no history of diseases nor engages in risky social behavior (examples are intravenous drug use or sexual activity with multiple partners). The donor's travel history is screened to minimize risk of transmitting infections, such as malaria. The donated blood is tested for signs of infectious diseases, such as HIV and hepatitis. The blood is then tested to be sure it is compatible with you in order to minimize the chance of a transfusion reaction. If you or a relative donates blood, this is often done in anticipation of surgery and is not appropriate for emergency situations. It takes many days to process the donated blood. RISKS AND COMPLICATIONS Although transfusion therapy is very safe and saves many lives, the main dangers of transfusion include:   Getting an infectious disease.  Developing a transfusion reaction. This is an allergic reaction to something in the blood you were given. Every precaution is taken to prevent this. The decision to have a blood transfusion has been considered carefully by your caregiver before blood is given. Blood is not given unless the benefits outweigh the risks. AFTER THE TRANSFUSION  Right after receiving a blood transfusion, you will usually feel much better and more energetic. This is especially true if your red blood cells have gotten low (anemic). The transfusion raises the level of the red blood cells which carry oxygen, and this usually causes an energy increase.  The  nurse administering the transfusion will monitor you carefully for complications. HOME CARE INSTRUCTIONS  No special instructions are needed after a transfusion. You may find your energy is better. Speak with your caregiver about any limitations on activity for underlying diseases you may have. SEEK MEDICAL CARE IF:   Your condition is not improving after your transfusion.  You develop redness or irritation at the intravenous (IV) site. SEEK IMMEDIATE MEDICAL CARE IF:  Any of the following  symptoms occur over the next 12 hours:  Shaking chills.  You have a temperature by mouth above 102 F (38.9 C), not controlled by medicine.  Chest, back, or muscle pain.  People around you feel you are not acting correctly or are confused.  Shortness of breath or difficulty breathing.  Dizziness and fainting.  You get a rash or develop hives.  You have a decrease in urine output.  Your urine turns a dark color or changes to pink, red, or brown. Any of the following symptoms occur over the next 10 days:  You have a temperature by mouth above 102 F (38.9 C), not controlled by medicine.  Shortness of breath.  Weakness after normal activity.  The white part of the eye turns yellow (jaundice).  You have a decrease in the amount of urine or are urinating less often.  Your urine turns a dark color or changes to pink, red, or brown. Document Released: 10/21/2000 Document Revised: 01/16/2012 Document Reviewed: 06/09/2008 [facility] Patient Information 2014 [place], [place].  _______________________________________________________________________

## 2018-06-14 ENCOUNTER — Encounter (HOSPITAL_COMMUNITY)
Admission: RE | Admit: 2018-06-14 | Discharge: 2018-06-14 | Disposition: A | Discharge status: Home / Self Care | Payer: 59 | Source: Ambulatory Visit | Attending: Orthopedic Surgery | Admitting: Orthopedic Surgery

## 2018-06-14 ENCOUNTER — Encounter (HOSPITAL_COMMUNITY): Payer: Self-pay

## 2018-06-14 ENCOUNTER — Other Ambulatory Visit: Payer: Self-pay

## 2018-06-14 DIAGNOSIS — Z01812 Encounter for preprocedural laboratory examination: Secondary | ICD-10-CM | POA: Insufficient documentation

## 2018-06-14 DIAGNOSIS — Z01810 Encounter for preprocedural cardiovascular examination: Secondary | ICD-10-CM | POA: Insufficient documentation

## 2018-06-14 DIAGNOSIS — R9431 Abnormal electrocardiogram [ECG] [EKG]: Secondary | ICD-10-CM | POA: Diagnosis not present

## 2018-06-14 HISTORY — DX: Male erectile dysfunction, unspecified: N52.9

## 2018-06-14 HISTORY — DX: Other specified abnormal findings of blood chemistry: R79.89

## 2018-06-14 HISTORY — DX: Obstructive sleep apnea (adult) (pediatric): G47.33

## 2018-06-14 HISTORY — DX: Overweight: E66.3

## 2018-06-14 HISTORY — DX: Unspecified osteoarthritis, unspecified site: M19.90

## 2018-06-14 HISTORY — DX: Hypokalemia: E87.6

## 2018-06-14 HISTORY — DX: Personal history of colonic polyps: Z86.010

## 2018-06-14 HISTORY — DX: Personal history of colon polyps, unspecified: Z86.0100

## 2018-06-14 HISTORY — DX: Pure hypercholesterolemia, unspecified: E78.00

## 2018-06-14 LAB — BASIC METABOLIC PANEL
Anion gap: 7 (ref 5–15)
BUN: 29 mg/dL — AB (ref 6–20)
CALCIUM: 9.7 mg/dL (ref 8.9–10.3)
CO2: 27 mmol/L (ref 22–32)
Chloride: 108 mmol/L (ref 98–111)
Creatinine, Ser: 1.83 mg/dL — ABNORMAL HIGH (ref 0.61–1.24)
GFR calc Af Amer: 46 mL/min — ABNORMAL LOW (ref >60)
GFR, EST NON AFRICAN AMERICAN: 40 mL/min — AB (ref >60)
Glucose, Bld: 133 mg/dL — ABNORMAL HIGH (ref 70–99)
POTASSIUM: 4.5 mmol/L (ref 3.5–5.1)
SODIUM: 142 mmol/L (ref 135–145)

## 2018-06-14 LAB — CBC
HCT: 41.7 % (ref 39.0–52.0)
Hemoglobin: 14.3 g/dL (ref 13.0–17.0)
MCH: 28 pg (ref 26.0–34.0)
MCHC: 34.3 g/dL (ref 30.0–36.0)
MCV: 81.8 fL (ref 78.0–100.0)
Platelets: 225 10*3/uL (ref 150–400)
RBC: 5.1 MIL/uL (ref 4.22–5.81)
RDW: 12.8 % (ref 11.5–15.5)
WBC: 8.7 10*3/uL (ref 4.0–10.5)

## 2018-06-14 LAB — HEMOGLOBIN A1C
HEMOGLOBIN A1C: 6.5 % — AB (ref 4.8–5.6)
Mean Plasma Glucose: 139.85 mg/dL

## 2018-06-14 LAB — ABO/RH: ABO/RH(D): O POS

## 2018-06-14 LAB — SURGICAL PCR SCREEN
MRSA, PCR: NEGATIVE
Staphylococcus aureus: NEGATIVE

## 2018-06-14 LAB — GLUCOSE, CAPILLARY: Glucose-Capillary: 135 mg/dL — ABNORMAL HIGH (ref 70–99)

## 2018-06-14 NOTE — Progress Notes (Signed)
BMp done 06/14/2018 faxed via epic to Dr Rod Can.

## 2018-06-14 NOTE — Progress Notes (Signed)
EKG-from 2016 from PCP- Dr Melford Aase on chart.

## 2018-06-15 NOTE — Pre-Procedure Instructions (Signed)
Hgb A1C results 06/14/18 faxed to Dr. Alvan Dame via epic.

## 2018-06-15 NOTE — Pre-Procedure Instructions (Signed)
Dr. Valma Cava reviewed EKG results 06/14/18 no new orders received at this time.

## 2018-06-18 NOTE — H&P (Signed)
UNICOMPARTMENTAL KNEE ADMISSION H&P  Patient is being admitted for left medial unicompartmental knee arthroplasty.  Subjective:  Chief Complaint:  Left knee medial compartmental primary OA /pain    HPI: Brae Gartman, 57 y.o. male , has a history of pain and functional disability in the left and has failed non-surgical conservative treatments for greater than 12 weeks to include NSAID's and/or analgesics, corticosteriod injections, viscosupplementation injections and activity modification.  Onset of symptoms was gradual, starting 1+ years ago with gradually worsening course since that time. The patient noted prior procedures on the knee to include  arthroscopy and menisectomy on the left knee(s).  Patient currently rates pain in the left knee(s) at 8 out of 10 with activity. Patient has worsening of pain with activity and weight bearing, pain that interferes with activities of daily living, pain with passive range of motion, crepitus and joint swelling.  Patient has evidence of periarticular osteophytes and joint space narrowing of the medial compartment by imaging studies.  There is no active infection.  Risks, benefits and expectations were discussed with the patient.  Risks including but not limited to the risk of anesthesia, blood clots, nerve damage, blood vessel damage, failure of the prosthesis, infection and up to and including death.  Patient understand the risks, benefits and expectations and wishes to proceed with surgery.   PCP: Chesley Noon, MD  D/C Plans:       Home  Post-op Meds:       No Rx given  Tranexamic Acid:      To be given - IV   Decadron:      Is to be given  FYI:      ASA  Norco  CPAP  DME:   Rx given for - RW   PT:   OPPT Rx given    Past Medical History:  Diagnosis Date  . Arthritis   . Bell's palsy   . Diabetes mellitus    type II   . ED (erectile dysfunction)   . History of colon polyps   . Hypercholesteremia   . Hypertension     . Hypokalemia   . Low testosterone   . OSA (obstructive sleep apnea)    cpap - does not know settings   . Overweight      Past Surgical History:  Procedure Laterality Date  . COLONOSCOPY W/ POLYPECTOMY    . HERNIA REPAIR  1995   vental wall  . KNEE ARTHROSCOPY  08/08/2012   Procedure: ARTHROSCOPY KNEE;  Surgeon: Gearlean Alf, MD;  Location: [facility];  Service: Orthopedics;  Laterality: Left;  medial meniscal debridement  . MENISCUS REPAIR  2002    Allergies  Allergen Reactions  . Metformin And Related     Diarrhea    Social History   Tobacco Use  . Smoking status: Never Smoker  . Smokeless tobacco: Never Used  Substance Use Topics  . Alcohol use: Yes    Comment: rare  . Drug use: No    Family History  Problem Relation Age of Onset  . Hypertension Mother   . Breast cancer Mother   . Hypertension Father   . Stroke Father   . Diabetes Father   . Arthritis Sister      Review of Systems  Constitutional: Negative.   HENT: Negative.   Eyes: Negative.   Respiratory: Negative.   Cardiovascular: Negative.   Gastrointestinal: Negative.   Genitourinary: Negative.   Musculoskeletal: Positive for joint pain.  Skin: Negative.   Neurological: Negative.   Endo/Heme/Allergies: Negative.   Psychiatric/Behavioral: Negative.      Objective:   Physical Exam  HENT:  Head: Normocephalic.  Eyes: Pupils are equal, round, and reactive to light.  Neck: Neck supple. No JVD present. No tracheal deviation present. No thyromegaly present.  Cardiovascular: Normal rate and intact distal pulses.  Pulmonary/Chest: Effort normal and breath sounds normal. No respiratory distress. He has no wheezes.  Abdominal: Soft. There is no tenderness. There is no guarding.  Musculoskeletal:       Left knee: He exhibits decreased range of motion, swelling and bony tenderness. He exhibits no ecchymosis, no deformity, no laceration and no erythema. Tenderness found. Medial joint line  tenderness noted. No lateral joint line tenderness noted.  Lymphadenopathy:    He has no cervical adenopathy.  Neurological: He is alert.  Skin: Skin is warm and dry.       Labs:  Estimated body mass index is 37.95 kg/m as calculated from the following:   Height as of 06/14/18: 5\' 9"  (1.753 m).   Weight as of 06/14/18: 116.6 kg.   Imaging Review Plain radiographs demonstrate severe degenerative joint disease of the left knee medial compartment.  The bone quality appears to be good for age and reported activity level.  Assessment/Plan:  End stage arthritis, left knee medial compartment  The patient history, physical examination, clinical judgment of the provider and imaging studies are consistent with end stage degenerative joint disease of the left knee(s) and medial unicompartmental knee arthroplasty is deemed medically necessary. The treatment options including medical management, injection therapy arthroscopy and arthroplasty were discussed at length. The risks and benefits of total knee arthroplasty were presented and reviewed. The risks due to aseptic loosening, infection, stiffness, patella tracking problems, thromboembolic complications and other imponderables were discussed. The patient acknowledged the explanation, agreed to proceed with the plan and consent was signed. Patient is being admitted for outpatient / observation treatment for surgery, pain control, PT, OT, prophylactic antibiotics, VTE prophylaxis, progressive ambulation and ADL's and discharge planning. The patient is planning to be discharged home.      [place] Davan Nawabi   PA-C  06/18/2018, 8:26 AM

## 2018-06-20 ENCOUNTER — Encounter (HOSPITAL_COMMUNITY): Payer: Self-pay | Admitting: Anesthesiology

## 2018-06-20 NOTE — Anesthesia Preprocedure Evaluation (Addendum)
Anesthesia Evaluation  Patient identified by MRN, date of birth, ID band Patient awake    Reviewed: Allergy & Precautions, NPO status , Patient's Chart, lab work & pertinent test results  Airway Mallampati: II       Dental no notable dental hx. (+) Teeth Intact   Pulmonary sleep apnea and Continuous Positive Airway Pressure Ventilation ,    Pulmonary exam normal breath sounds clear to auscultation       Cardiovascular hypertension, Pt. on medications Normal cardiovascular exam Rhythm:Regular Rate:Normal     Neuro/Psych    GI/Hepatic   Endo/Other  diabetes, Oral Hypoglycemic Agents  Renal/GU Renal InsufficiencyRenal disease     Musculoskeletal   Abdominal (+) + obese,   Peds  Hematology   Anesthesia Other Findings   Reproductive/Obstetrics                            Anesthesia Physical Anesthesia Plan  ASA: II  Anesthesia Plan: Spinal   Post-op Pain Management:  Regional for Post-op pain   Induction:   PONV Risk Score and Plan:   Airway Management Planned: Natural Airway, Nasal Cannula and Simple Face Mask  Additional Equipment:   Intra-op Plan:   Post-operative Plan:   Informed Consent: I have reviewed the patients History and Physical, chart, labs and discussed the procedure including the risks, benefits and alternatives for the proposed anesthesia with the patient or authorized representative who has indicated his/her understanding and acceptance.   Dental advisory given  Plan Discussed with: CRNA and Surgeon  Anesthesia Plan Comments:        Anesthesia Quick Evaluation

## 2018-06-21 ENCOUNTER — Observation Stay (HOSPITAL_COMMUNITY)
Admission: RE | Admit: 2018-06-21 | Discharge: 2018-06-22 | Disposition: A | Discharge status: Home / Self Care | Payer: 59 | Source: Ambulatory Visit | Attending: Orthopedic Surgery | Admitting: Orthopedic Surgery

## 2018-06-21 ENCOUNTER — Ambulatory Visit (HOSPITAL_COMMUNITY): Payer: 59 | Admitting: Anesthesiology

## 2018-06-21 ENCOUNTER — Encounter (HOSPITAL_COMMUNITY): Payer: Self-pay | Admitting: Certified Registered Nurse Anesthetist

## 2018-06-21 ENCOUNTER — Other Ambulatory Visit: Payer: Self-pay

## 2018-06-21 ENCOUNTER — Encounter (HOSPITAL_COMMUNITY)
Admission: RE | Disposition: A | Discharge status: Home / Self Care | Payer: Self-pay | Source: Ambulatory Visit | Attending: Orthopedic Surgery

## 2018-06-21 DIAGNOSIS — Z888 Allergy status to other drugs, medicaments and biological substances status: Secondary | ICD-10-CM | POA: Insufficient documentation

## 2018-06-21 DIAGNOSIS — Z6837 Body mass index (BMI) 37.0-37.9, adult: Secondary | ICD-10-CM | POA: Insufficient documentation

## 2018-06-21 DIAGNOSIS — I10 Essential (primary) hypertension: Secondary | ICD-10-CM | POA: Insufficient documentation

## 2018-06-21 DIAGNOSIS — E119 Type 2 diabetes mellitus without complications: Secondary | ICD-10-CM | POA: Insufficient documentation

## 2018-06-21 DIAGNOSIS — M25562 Pain in left knee: Secondary | ICD-10-CM | POA: Insufficient documentation

## 2018-06-21 DIAGNOSIS — Z96652 Presence of left artificial knee joint: Secondary | ICD-10-CM

## 2018-06-21 DIAGNOSIS — M1712 Unilateral primary osteoarthritis, left knee: Secondary | ICD-10-CM | POA: Diagnosis present

## 2018-06-21 DIAGNOSIS — E669 Obesity, unspecified: Secondary | ICD-10-CM | POA: Diagnosis not present

## 2018-06-21 DIAGNOSIS — G4733 Obstructive sleep apnea (adult) (pediatric): Secondary | ICD-10-CM | POA: Insufficient documentation

## 2018-06-21 DIAGNOSIS — E7800 Pure hypercholesterolemia, unspecified: Secondary | ICD-10-CM | POA: Diagnosis not present

## 2018-06-21 DIAGNOSIS — M1990 Unspecified osteoarthritis, unspecified site: Secondary | ICD-10-CM | POA: Insufficient documentation

## 2018-06-21 DIAGNOSIS — Z7984 Long term (current) use of oral hypoglycemic drugs: Secondary | ICD-10-CM | POA: Insufficient documentation

## 2018-06-21 DIAGNOSIS — Z79899 Other long term (current) drug therapy: Secondary | ICD-10-CM | POA: Insufficient documentation

## 2018-06-21 HISTORY — PX: PARTIAL KNEE ARTHROPLASTY: SHX2174

## 2018-06-21 LAB — TYPE AND SCREEN
ABO/RH(D): O POS
Antibody Screen: NEGATIVE

## 2018-06-21 LAB — GLUCOSE, CAPILLARY
GLUCOSE-CAPILLARY: 177 mg/dL — AB (ref 70–99)
Glucose-Capillary: 153 mg/dL — ABNORMAL HIGH (ref 70–99)
Glucose-Capillary: 227 mg/dL — ABNORMAL HIGH (ref 70–99)
Glucose-Capillary: 225 mg/dL — ABNORMAL HIGH (ref 70–99)

## 2018-06-21 SURGERY — ARTHROPLASTY, KNEE, UNICOMPARTMENTAL
Anesthesia: Spinal | Site: Knee | Laterality: Left

## 2018-06-21 MED ORDER — PROMETHAZINE HCL 25 MG/ML IJ SOLN: 6.2500 mg | INTRAMUSCULAR | Status: DC | PRN

## 2018-06-21 MED ORDER — DOCUSATE SODIUM 100 MG PO CAPS: 100.0000 mg | ORAL_CAPSULE | Freq: Two times a day (BID) | ORAL | 0 refills | Status: DC

## 2018-06-21 MED ORDER — TRANEXAMIC ACID 1000 MG/10ML IV SOLN
1000.0000 mg | Freq: Once | INTRAVENOUS | Status: AC
  Administered 2018-06-21: 1000 mg via INTRAVENOUS
  Filled 2018-06-21: qty 1000

## 2018-06-21 MED ORDER — HYDROMORPHONE HCL 1 MG/ML IJ SOLN: 0.2500 mg | INTRAMUSCULAR | Status: DC | PRN

## 2018-06-21 MED ORDER — ROSUVASTATIN CALCIUM 20 MG PO TABS
40.0000 mg | ORAL_TABLET | Freq: Every day | ORAL | Status: DC
  Administered 2018-06-21: 40 mg via ORAL
  Filled 2018-06-21: qty 2

## 2018-06-21 MED ORDER — ALUM & MAG HYDROXIDE-SIMETH 200-200-20 MG/5ML PO SUSP: 15.0000 mL | ORAL | Status: DC | PRN

## 2018-06-21 MED ORDER — METOCLOPRAMIDE HCL 5 MG PO TABS: 5.0000 mg | ORAL_TABLET | Freq: Three times a day (TID) | ORAL | Status: DC | PRN

## 2018-06-21 MED ORDER — KETOROLAC TROMETHAMINE 30 MG/ML IJ SOLN
INTRAMUSCULAR | Status: AC
  Filled 2018-06-21: qty 1

## 2018-06-21 MED ORDER — STERILE WATER FOR IRRIGATION IR SOLN
Status: DC | PRN
  Administered 2018-06-21: 1000 mL

## 2018-06-21 MED ORDER — CEFAZOLIN SODIUM-DEXTROSE 2-4 GM/100ML-% IV SOLN
2.0000 g | Freq: Four times a day (QID) | INTRAVENOUS | Status: AC
  Administered 2018-06-21 (×2): 2 g via INTRAVENOUS
  Filled 2018-06-21 (×2): qty 100

## 2018-06-21 MED ORDER — LABETALOL HCL 5 MG/ML IV SOLN
10.0000 mg | Freq: Four times a day (QID) | INTRAVENOUS | Status: DC | PRN
  Administered 2018-06-21: 10 mg via INTRAVENOUS
  Filled 2018-06-21 (×3): qty 4

## 2018-06-21 MED ORDER — DIPHENHYDRAMINE HCL 12.5 MG/5ML PO ELIX: 12.5000 mg | ORAL_SOLUTION | ORAL | Status: DC | PRN

## 2018-06-21 MED ORDER — 0.9 % SODIUM CHLORIDE (POUR BTL) OPTIME
TOPICAL | Status: DC | PRN
  Administered 2018-06-21: 1000 mL

## 2018-06-21 MED ORDER — BUPIVACAINE-EPINEPHRINE (PF) 0.25% -1:200000 IJ SOLN
INTRAMUSCULAR | Status: AC
  Filled 2018-06-21: qty 30

## 2018-06-21 MED ORDER — METOCLOPRAMIDE HCL 5 MG/ML IJ SOLN: 5.0000 mg | Freq: Three times a day (TID) | INTRAMUSCULAR | Status: DC | PRN

## 2018-06-21 MED ORDER — CARVEDILOL 25 MG PO TABS
25.0000 mg | ORAL_TABLET | Freq: Two times a day (BID) | ORAL | Status: DC
  Administered 2018-06-21 – 2018-06-22 (×2): 25 mg via ORAL
  Filled 2018-06-21 (×3): qty 1

## 2018-06-21 MED ORDER — BISACODYL 10 MG RE SUPP: 10.0000 mg | Freq: Every day | RECTAL | Status: DC | PRN

## 2018-06-21 MED ORDER — FENTANYL CITRATE (PF) 100 MCG/2ML IJ SOLN
50.0000 ug | INTRAMUSCULAR | Status: DC
  Administered 2018-06-21: 100 ug via INTRAVENOUS
  Filled 2018-06-21: qty 2

## 2018-06-21 MED ORDER — POLYETHYLENE GLYCOL 3350 17 G PO PACK: 17.0000 g | PACK | Freq: Two times a day (BID) | ORAL | 0 refills | Status: DC

## 2018-06-21 MED ORDER — DULAGLUTIDE 0.75 MG/0.5ML ~~LOC~~ SOAJ: 0.7500 mg | SUBCUTANEOUS | Status: DC

## 2018-06-21 MED ORDER — POLYETHYLENE GLYCOL 3350 17 G PO PACK
17.0000 g | PACK | Freq: Two times a day (BID) | ORAL | Status: DC
  Administered 2018-06-21 – 2018-06-22 (×2): 17 g via ORAL
  Filled 2018-06-21 (×2): qty 1

## 2018-06-21 MED ORDER — SODIUM CHLORIDE 0.9 % IJ SOLN
INTRAMUSCULAR | Status: DC | PRN
  Administered 2018-06-21: 9 mL

## 2018-06-21 MED ORDER — INSULIN ASPART 100 UNIT/ML ~~LOC~~ SOLN
0.0000 [IU] | Freq: Three times a day (TID) | SUBCUTANEOUS | Status: DC
  Administered 2018-06-21: 5 [IU] via SUBCUTANEOUS
  Administered 2018-06-22: 3 [IU] via SUBCUTANEOUS

## 2018-06-21 MED ORDER — HYDROCODONE-ACETAMINOPHEN 7.5-325 MG PO TABS: 1.0000 | ORAL_TABLET | ORAL | 0 refills | Status: DC | PRN

## 2018-06-21 MED ORDER — PROPOFOL 10 MG/ML IV BOLUS
INTRAVENOUS | Status: AC
  Filled 2018-06-21: qty 40

## 2018-06-21 MED ORDER — FERROUS SULFATE 325 (65 FE) MG PO TABS
325.0000 mg | ORAL_TABLET | Freq: Two times a day (BID) | ORAL | Status: DC
  Administered 2018-06-22: 325 mg via ORAL
  Filled 2018-06-21: qty 1

## 2018-06-21 MED ORDER — BUPIVACAINE IN DEXTROSE 0.75-8.25 % IT SOLN
INTRATHECAL | Status: DC | PRN
  Administered 2018-06-21: 1.8 mL via INTRATHECAL

## 2018-06-21 MED ORDER — SODIUM CHLORIDE 0.9 % IJ SOLN
INTRAMUSCULAR | Status: AC
  Filled 2018-06-21: qty 10

## 2018-06-21 MED ORDER — MENTHOL 3 MG MT LOZG: 1.0000 | LOZENGE | OROMUCOSAL | Status: DC | PRN

## 2018-06-21 MED ORDER — SPIRONOLACTONE 25 MG PO TABS
50.0000 mg | ORAL_TABLET | Freq: Two times a day (BID) | ORAL | Status: DC
  Administered 2018-06-21 – 2018-06-22 (×2): 50 mg via ORAL
  Filled 2018-06-21 (×2): qty 2

## 2018-06-21 MED ORDER — MIDAZOLAM HCL 2 MG/2ML IJ SOLN
1.0000 mg | INTRAMUSCULAR | Status: DC
  Administered 2018-06-21: 2 mg via INTRAVENOUS
  Filled 2018-06-21: qty 2

## 2018-06-21 MED ORDER — DEXAMETHASONE SODIUM PHOSPHATE 10 MG/ML IJ SOLN
10.0000 mg | Freq: Once | INTRAMUSCULAR | Status: AC
  Administered 2018-06-21: 8 mg via INTRAVENOUS

## 2018-06-21 MED ORDER — BUPIVACAINE-EPINEPHRINE 0.25% -1:200000 IJ SOLN
INTRAMUSCULAR | Status: DC | PRN
  Administered 2018-06-21: 30 mL

## 2018-06-21 MED ORDER — HYDROMORPHONE HCL 1 MG/ML IJ SOLN: 0.5000 mg | INTRAMUSCULAR | Status: DC | PRN

## 2018-06-21 MED ORDER — PROPOFOL 500 MG/50ML IV EMUL
INTRAVENOUS | Status: DC | PRN
  Administered 2018-06-21: 75 ug/kg/min via INTRAVENOUS

## 2018-06-21 MED ORDER — METFORMIN HCL ER 500 MG PO TB24
500.0000 mg | ORAL_TABLET | Freq: Every day | ORAL | Status: DC
  Filled 2018-06-21 (×3): qty 1

## 2018-06-21 MED ORDER — CHLORHEXIDINE GLUCONATE 4 % EX LIQD: 60.0000 mL | Freq: Once | CUTANEOUS | Status: DC

## 2018-06-21 MED ORDER — HYDROCODONE-ACETAMINOPHEN 7.5-325 MG PO TABS: 1.0000 | ORAL_TABLET | ORAL | Status: DC | PRN

## 2018-06-21 MED ORDER — PROPOFOL 10 MG/ML IV BOLUS
INTRAVENOUS | Status: AC
Start: 2018-06-21 — End: ?
  Filled 2018-06-21: qty 40

## 2018-06-21 MED ORDER — ONDANSETRON HCL 4 MG/2ML IJ SOLN
INTRAMUSCULAR | Status: DC | PRN
  Administered 2018-06-21: 4 mg via INTRAVENOUS

## 2018-06-21 MED ORDER — PHENOL 1.4 % MT LIQD: 1.0000 | OROMUCOSAL | Status: DC | PRN

## 2018-06-21 MED ORDER — TRANEXAMIC ACID 1000 MG/10ML IV SOLN
1000.0000 mg | INTRAVENOUS | Status: AC
  Administered 2018-06-21: 1000 mg via INTRAVENOUS
  Filled 2018-06-21: qty 10

## 2018-06-21 MED ORDER — DEXAMETHASONE SODIUM PHOSPHATE 10 MG/ML IJ SOLN
INTRAMUSCULAR | Status: AC
  Filled 2018-06-21: qty 1

## 2018-06-21 MED ORDER — ACETAMINOPHEN 325 MG PO TABS: 325.0000 mg | ORAL_TABLET | Freq: Four times a day (QID) | ORAL | Status: DC | PRN

## 2018-06-21 MED ORDER — AMLODIPINE BESYLATE 10 MG PO TABS
10.0000 mg | ORAL_TABLET | Freq: Every morning | ORAL | Status: DC
  Administered 2018-06-22: 10 mg via ORAL
  Filled 2018-06-21 (×2): qty 1

## 2018-06-21 MED ORDER — ONDANSETRON HCL 4 MG PO TABS: 4.0000 mg | ORAL_TABLET | Freq: Four times a day (QID) | ORAL | Status: DC | PRN

## 2018-06-21 MED ORDER — ACETAMINOPHEN 10 MG/ML IV SOLN: 1000.0000 mg | Freq: Once | INTRAVENOUS | Status: DC | PRN

## 2018-06-21 MED ORDER — METHOCARBAMOL 500 MG IVPB - SIMPLE MED
500.0000 mg | Freq: Four times a day (QID) | INTRAVENOUS | Status: DC | PRN
  Filled 2018-06-21: qty 50

## 2018-06-21 MED ORDER — MEPERIDINE HCL 50 MG/ML IJ SOLN: 6.2500 mg | INTRAMUSCULAR | Status: DC | PRN

## 2018-06-21 MED ORDER — MAGNESIUM CITRATE PO SOLN: 1.0000 | Freq: Once | ORAL | Status: DC | PRN

## 2018-06-21 MED ORDER — HYDROCODONE-ACETAMINOPHEN 5-325 MG PO TABS
1.0000 | ORAL_TABLET | ORAL | Status: DC | PRN
  Administered 2018-06-21 (×2): 1 via ORAL
  Administered 2018-06-22 (×2): 2 via ORAL
  Filled 2018-06-21: qty 2
  Filled 2018-06-21: qty 1
  Filled 2018-06-21 (×2): qty 2

## 2018-06-21 MED ORDER — ONDANSETRON HCL 4 MG/2ML IJ SOLN
INTRAMUSCULAR | Status: AC
  Filled 2018-06-21: qty 2

## 2018-06-21 MED ORDER — CELECOXIB 200 MG PO CAPS
200.0000 mg | ORAL_CAPSULE | Freq: Two times a day (BID) | ORAL | Status: DC
Start: 2018-06-21 — End: 2018-06-22
  Administered 2018-06-21: 200 mg via ORAL
  Filled 2018-06-21: qty 1

## 2018-06-21 MED ORDER — ASPIRIN 81 MG PO CHEW
81.0000 mg | CHEWABLE_TABLET | Freq: Two times a day (BID) | ORAL | Status: DC
  Administered 2018-06-21 – 2018-06-22 (×2): 81 mg via ORAL
  Filled 2018-06-21 (×2): qty 1

## 2018-06-21 MED ORDER — FERROUS SULFATE 325 (65 FE) MG PO TABS: 325.0000 mg | ORAL_TABLET | Freq: Three times a day (TID) | ORAL | 3 refills | Status: DC

## 2018-06-21 MED ORDER — DEXAMETHASONE SODIUM PHOSPHATE 10 MG/ML IJ SOLN
10.0000 mg | Freq: Once | INTRAMUSCULAR | Status: AC
  Administered 2018-06-22: 10 mg via INTRAVENOUS
  Filled 2018-06-21: qty 1

## 2018-06-21 MED ORDER — ONDANSETRON HCL 4 MG/2ML IJ SOLN: 4.0000 mg | Freq: Four times a day (QID) | INTRAMUSCULAR | Status: DC | PRN

## 2018-06-21 MED ORDER — HYDRALAZINE HCL 50 MG PO TABS
50.0000 mg | ORAL_TABLET | Freq: Two times a day (BID) | ORAL | Status: DC
  Administered 2018-06-21 – 2018-06-22 (×2): 50 mg via ORAL
  Filled 2018-06-21 (×3): qty 1

## 2018-06-21 MED ORDER — ROPIVACAINE HCL 7.5 MG/ML IJ SOLN
INTRAMUSCULAR | Status: DC | PRN
  Administered 2018-06-21 (×6): 5 mL via PERINEURAL

## 2018-06-21 MED ORDER — METHOCARBAMOL 500 MG PO TABS
500.0000 mg | ORAL_TABLET | Freq: Four times a day (QID) | ORAL | Status: DC | PRN
Start: 2018-06-21 — End: 2018-06-22
  Administered 2018-06-22: 500 mg via ORAL
  Filled 2018-06-21: qty 1

## 2018-06-21 MED ORDER — CEFAZOLIN SODIUM-DEXTROSE 2-4 GM/100ML-% IV SOLN
2.0000 g | INTRAVENOUS | Status: AC
Start: 2018-06-21 — End: 2018-06-21
  Administered 2018-06-21: 2 g via INTRAVENOUS
  Filled 2018-06-21: qty 100

## 2018-06-21 MED ORDER — METHOCARBAMOL 500 MG PO TABS: 500.0000 mg | ORAL_TABLET | Freq: Four times a day (QID) | ORAL | 0 refills | Status: DC | PRN

## 2018-06-21 MED ORDER — ASPIRIN 81 MG PO CHEW: 81.0000 mg | CHEWABLE_TABLET | Freq: Two times a day (BID) | ORAL | 0 refills | Status: AC

## 2018-06-21 MED ORDER — KETOROLAC TROMETHAMINE 30 MG/ML IJ SOLN
INTRAMUSCULAR | Status: DC | PRN
  Administered 2018-06-21: 30 mg

## 2018-06-21 MED ORDER — SODIUM CHLORIDE 0.9 % IV SOLN
INTRAVENOUS | Status: DC
  Administered 2018-06-21: 12:00:00 via INTRAVENOUS

## 2018-06-21 MED ORDER — DOCUSATE SODIUM 100 MG PO CAPS
100.0000 mg | ORAL_CAPSULE | Freq: Two times a day (BID) | ORAL | Status: DC
  Administered 2018-06-21 – 2018-06-22 (×2): 100 mg via ORAL
  Filled 2018-06-21 (×2): qty 1

## 2018-06-21 MED ORDER — LACTATED RINGERS IV SOLN
INTRAVENOUS | Status: DC
  Administered 2018-06-21 (×2): via INTRAVENOUS

## 2018-06-21 SURGICAL SUPPLY — 53 items
ADH SKN CLS APL DERMABOND .7 (GAUZE/BANDAGES/DRESSINGS) ×1
BAG DECANTER FOR FLEXI CONT (MISCELLANEOUS) IMPLANT
BAG SPEC THK2 15X12 ZIP CLS (MISCELLANEOUS)
BAG ZIPLOCK 12X15 (MISCELLANEOUS) IMPLANT
BANDAGE ACE 6X5 VEL STRL LF (GAUZE/BANDAGES/DRESSINGS) ×3 IMPLANT
BEARING TIBIAL OXFORD MED 4 (Orthopedic Implant) ×1 IMPLANT
BEARING TIBIAL OXFORD MED 4MM (Orthopedic Implant) ×1 IMPLANT
BLADE SAW RECIPROCATING 77.5 (BLADE) ×3 IMPLANT
BLADE SAW SGTL 13.0X1.19X90.0M (BLADE) ×3 IMPLANT
BOWL SMART MIX CTS (DISPOSABLE) ×3 IMPLANT
BRNG TIB MED 4 PHS 3 LT MEN (Orthopedic Implant) ×1 IMPLANT
CEMENT BONE R 1X40 (Cement) ×2 IMPLANT
COMPONENT TIBIA MEDL OXFRD LFT (Joint) IMPLANT
COVER SURGICAL LIGHT HANDLE (MISCELLANEOUS) ×3 IMPLANT
CUFF TOURN SGL QUICK 34 (TOURNIQUET CUFF) ×3
CUFF TRNQT CYL 34X4X40X1 (TOURNIQUET CUFF) ×1 IMPLANT
DECANTER SPIKE VIAL GLASS SM (MISCELLANEOUS) ×2 IMPLANT
DERMABOND ADVANCED (GAUZE/BANDAGES/DRESSINGS) ×2
DERMABOND ADVANCED .7 DNX12 (GAUZE/BANDAGES/DRESSINGS) ×1 IMPLANT
DRAPE U-SHAPE 47X51 STRL (DRAPES) ×3 IMPLANT
DRESSING AQUACEL AG SP 3.5X10 (GAUZE/BANDAGES/DRESSINGS) ×1 IMPLANT
DRSG AQUACEL AG SP 3.5X10 (GAUZE/BANDAGES/DRESSINGS) ×3
DURAPREP 26ML APPLICATOR (WOUND CARE) ×6 IMPLANT
ELECT REM PT RETURN 15FT ADLT (MISCELLANEOUS) ×3 IMPLANT
GLOVE BIOGEL M 7.0 STRL (GLOVE) IMPLANT
GLOVE BIOGEL PI IND STRL 7.5 (GLOVE) ×1 IMPLANT
GLOVE BIOGEL PI IND STRL 8.5 (GLOVE) ×1 IMPLANT
GLOVE BIOGEL PI INDICATOR 7.5 (GLOVE) ×2
GLOVE BIOGEL PI INDICATOR 8.5 (GLOVE) ×2
GLOVE ECLIPSE 8.0 STRL XLNG CF (GLOVE) ×3 IMPLANT
GLOVE ORTHO TXT STRL SZ7.5 (GLOVE) ×4 IMPLANT
GOWN STRL REUS W/TWL 2XL LVL3 (GOWN DISPOSABLE) ×1 IMPLANT
GOWN STRL REUS W/TWL LRG LVL3 (GOWN DISPOSABLE) ×3 IMPLANT
GOWN STRL REUS W/TWL XL LVL3 (GOWN DISPOSABLE) ×3 IMPLANT
HOLDER FOLEY CATH W/STRAP (MISCELLANEOUS) IMPLANT
LEGGING LITHOTOMY PAIR STRL (DRAPES) ×3 IMPLANT
MANIFOLD NEPTUNE II (INSTRUMENTS) ×3 IMPLANT
NDL SAFETY ECLIPSE 18X1.5 (NEEDLE) IMPLANT
NEEDLE HYPO 18GX1.5 SHARP (NEEDLE)
PACK TOTAL KNEE CUSTOM (KITS) ×3 IMPLANT
PEG TWIN FEM CEMENTED MED (Knees) ×2 IMPLANT
SUT MNCRL AB 4-0 PS2 18 (SUTURE) ×3 IMPLANT
SUT STRATAFIX 0 PDS 27 VIOLET (SUTURE) ×3
SUT VIC AB 1 CT1 36 (SUTURE) ×3 IMPLANT
SUT VIC AB 2-0 CT1 27 (SUTURE) ×6
SUT VIC AB 2-0 CT1 TAPERPNT 27 (SUTURE) ×2 IMPLANT
SUTURE STRATFX 0 PDS 27 VIOLET (SUTURE) ×1 IMPLANT
SYR 3ML LL SCALE MARK (SYRINGE) IMPLANT
SYR 50ML LL SCALE MARK (SYRINGE) ×1 IMPLANT
TIBIA MEDIAL OXFORD LEFT (Joint) ×3 IMPLANT
TRAY FOLEY MTR SLVR 16FR STAT (SET/KITS/TRAYS/PACK) ×2 IMPLANT
WRAP KNEE MAXI GEL POST OP (GAUZE/BANDAGES/DRESSINGS) ×2 IMPLANT
YANKAUER SUCT BULB TIP NO VENT (SUCTIONS) ×3 IMPLANT

## 2018-06-21 NOTE — Plan of Care (Signed)
  Problem: Education: Goal: Knowledge of General Education information will improve Description Including pain rating scale, medication(s)/side effects and non-pharmacologic comfort measures Outcome: Progressing   Problem: Health Behavior/Discharge Planning: Goal: Ability to manage health-related needs will improve Outcome: Progressing   Problem: Clinical Measurements: Goal: Ability to maintain clinical measurements within normal limits will improve Outcome: Progressing   Problem: Clinical Measurements: Goal: Will remain free from infection Outcome: Progressing   Problem: Clinical Measurements: Goal: Diagnostic test results will improve Outcome: Progressing   Problem: Activity: Goal: Risk for activity intolerance will decrease Outcome: Progressing   

## 2018-06-21 NOTE — Anesthesia Procedure Notes (Signed)
Spinal  End time: 06/21/2018 9:06 AM Staffing Anesthesiologist: Lyn Hollingshead, MD Resident/CRNA: Maxwell Caul, CRNA Performed: resident/CRNA  Preanesthetic Checklist Completed: patient identified, site marked, surgical consent, pre-op evaluation, timeout performed, IV checked, risks and benefits discussed and monitors and equipment checked Spinal Block Patient position: sitting Prep: DuraPrep Patient monitoring: heart rate, continuous pulse ox and blood pressure Approach: midline Location: L3-4 Injection technique: single-shot Needle Needle type: Pencan  Needle gauge: 24 G Needle length: 10 cm Additional Notes Patient sitting for SAB placement. Single shot. Negative heme/negative paresthesia. Patient tolerated well. Dr Jillyn Hidden at bedside during entire placement.  LOT # [id number]. Expiration date 08-07-2019.

## 2018-06-21 NOTE — Transfer of Care (Signed)
Immediate Anesthesia Transfer of Care Note  Patient: Bradley Perez  Procedure(s) Performed: LEFT UNICOMPARTMENTAL KNEE-MEDIALLY (Left Knee)  Patient Location: PACU  Anesthesia Type:Regional and Spinal  Level of Consciousness: awake, alert  and oriented  Airway & Oxygen Therapy: Patient Spontanous Breathing and Patient connected to face mask oxygen  Post-op Assessment: Report given to RN and Post -op Vital signs reviewed and stable  Post vital signs: Reviewed and stable  Last Vitals:  Vitals Value Taken Time  BP    Temp    Pulse 82 06/21/2018 10:57 AM  Resp 16 06/21/2018 10:57 AM  SpO2 97 % 06/21/2018 10:57 AM  Vitals shown include unvalidated device data.  Last Pain:  Vitals:   06/21/18 0708  TempSrc:   PainSc: 0-No pain      Patients Stated Pain Goal: 4 (16/10/96 0454)  Complications: No apparent anesthesia complications

## 2018-06-21 NOTE — Anesthesia Procedure Notes (Addendum)
Anesthesia Regional Block: Adductor canal block   Pre-Anesthetic Checklist: ,, timeout performed, Correct Patient, Correct Site, Correct Laterality, Correct Procedure, Correct Position, site marked, Risks and benefits discussed,  Surgical consent,  Pre-op evaluation,  At surgeon's request and post-op pain management  Laterality: Left and Lower  Prep: chloraprep       Needles:  Injection technique: Single-shot  Needle Type: Echogenic Stimulator Needle     Needle Length: 10cm  Needle Gauge: 21   Needle insertion depth: 3 cm   Additional Needles:   Procedures:,,,, ultrasound used (permanent image in chart),,,,  Narrative:  Start time: 06/21/2018 8:00 AM End time: 06/21/2018 8:10 AM Injection made incrementally with aspirations every 5 mL.  Performed by: Personally  Anesthesiologist: Lyn Hollingshead, MD

## 2018-06-21 NOTE — Anesthesia Postprocedure Evaluation (Signed)
Anesthesia Post Note  Patient: Bradley Perez  Procedure(s) Performed: LEFT UNICOMPARTMENTAL KNEE-MEDIALLY (Left Knee)     Patient location during evaluation: PACU Anesthesia Type: Spinal Level of consciousness: awake Pain management: pain level controlled Vital Signs Assessment: post-procedure vital signs reviewed and stable Cardiovascular status: stable Postop Assessment: no headache, no backache, spinal receding, no apparent nausea or vomiting and patient able to bend at knees Anesthetic complications: no    Last Vitals:  Vitals:   06/21/18 1145 06/21/18 1200  BP: (!) 149/83 (!) 160/86  Pulse: 66 64  Resp: 19 20  Temp: 36.5 C 37 C  SpO2: 98% 99%    Last Pain:  Vitals:   06/21/18 1145  TempSrc:   PainSc: 0-No pain   Pain Goal: Patients Stated Pain Goal: 4 (06/21/18 0708)               [place]

## 2018-06-21 NOTE — Interval H&P Note (Signed)
History and Physical Interval Note:  06/21/2018 7:31 AM  Bradley Perez  has presented today for surgery, with the diagnosis of Left knee osteoarthritis-medially  The various methods of treatment have been discussed with the patient and family. After consideration of risks, benefits and other options for treatment, the patient has consented to  Procedure(s) with comments: LEFT UNICOMPARTMENTAL KNEE-MEDIALLY (Left) - 90 mins as a surgical intervention .  The patient's history has been reviewed, patient examined, no change in status, stable for surgery.  I have reviewed the patient's chart and labs.  Questions were answered to the patient's satisfaction.     Mauri Pole

## 2018-06-21 NOTE — Evaluation (Signed)
Physical Therapy Evaluation Patient Details Name: Bradley Perez MRN: [id number] DOB: 04/06/1961 Today's Date: 06/21/2018   History of Present Illness  Pt is a 57 YO male s/p L partial knee replacement of the medial compartment. PMH includes OA, DM II, ED, HTN, hypokalemia. Surgical history includes hernia repair, L knee arthroscopy 2013.  Clinical Impression   PT is a 57 YO male s/p L partial knee replacement on 06/21/18. PMH as noted above. Pt with L knee pain at 3/10, decreased activity tolerance for ambulation, and L quadriceps weakness demonstrated in 2 episodes of slight L knee buckling during ambulation and decreased activation of L quadriceps during quad sets. Pt with BP of 167/95 in supine, and BP reaching 187/106 in sitting position. Per nursing, okay to mobilize and ambulate a short distance due to pt's PMH of HTN. Pt ambulated 20 feet with RW and chair follow for safety. D/c plan is to go home and receive OPPT starting Monday. Will continue to follow acutely.     Follow Up Recommendations Follow surgeon's recommendation for DC plan and follow-up therapies(Surgeon recommending OPPT )    Equipment Recommendations  Rolling walker with 5" wheels    Recommendations for Other Services       Precautions / Restrictions Precautions Precautions: Knee;None Restrictions Weight Bearing Restrictions: No Other Position/Activity Restrictions: WBAT       Mobility  Bed Mobility Overal bed mobility: Needs Assistance Bed Mobility: Supine to Sit     Supine to sit: Min guard;Min assist     General bed mobility comments: Min guard for safety when moving from supine to sit. Min assist for scooting to EOB and LLE managment. BP taken, 187/106 sitting EOB. Per RN, appropriate to walk with pt due to PMH of HTN.   Transfers Overall transfer level: Needs assistance Equipment used: Rolling walker (2 wheeled) Transfers: Sit to/from Stand Sit to Stand: Min guard         General transfer  comment: Min guard for safety. No physical assist required. Pregait tasks included weight shifting R to L, alternating stepping R and L.   Ambulation/Gait Ambulation/Gait assistance: Min assist Gait Distance (Feet): 20 Feet Assistive device: Rolling walker (2 wheeled) Gait Pattern/deviations: Step-to pattern;Decreased stance time - left;Decreased weight shift to left;Antalgic Gait velocity: decreased    General Gait Details: Min assist for 2 episodes of L knee buckling, pt and PT recovered. Otherwise, pt min guard for safety. Verbal cuing for sequencing (push RW, LLE, RLE). Chair follow due to high BP   Stairs            Wheelchair Mobility    Modified Rankin (Stroke Patients Only)       Balance Overall balance assessment: Mild deficits observed, not formally tested                                           Pertinent Vitals/Pain Pain Assessment: 0-10 Pain Score: 4  Pain Location: L knee  Pain Descriptors / Indicators: Sore Pain Intervention(s): Limited activity within patient's tolerance;Repositioned;Ice applied;Monitored during session    [place] expects to be discharged to:: Private residence Living Arrangements: Spouse/significant other Available Help at Discharge: Family;Available PRN/intermittently Type of Home: House Home Access: Stairs to enter Entrance Stairs-Rails: None Entrance Stairs-Number of Steps: 3 Home Layout: Two level; flight of stairs to get to pt's bedroom Home Equipment: None  Prior Function Level of Independence: Independent               Hand Dominance        Extremity/Trunk Assessment   Upper Extremity Assessment Upper Extremity Assessment: Overall WFL for tasks assessed    Lower Extremity Assessment Lower Extremity Assessment: LLE deficits/detail LLE Deficits / Details: Suspected post-surgical LLE weakness, demonstrated in 2 bouts of L knee buckling upon weight bearing. Able to  perform ankle pumps x10, quad sets x10, glute sets x2 LLE: Unable to fully assess due to pain LLE Sensation: decreased light touch(decreased light touch along medial lower leg)    Cervical / Trunk Assessment Cervical / Trunk Assessment: Normal  Communication   Communication: No difficulties  Cognition Arousal/Alertness: Awake/alert Behavior During Therapy: WFL for tasks assessed/performed Overall Cognitive Status: Within Functional Limits for tasks assessed                                        General Comments      Exercises Total Joint Exercises Ankle Circles/Pumps: AROM;Both;10 reps;Seated Quad Sets: AROM;Left;10 reps;Supine   Assessment/Plan    PT Assessment Patient needs continued PT services  PT Problem List Decreased strength;Pain;Decreased activity tolerance;Decreased knowledge of use of DME;Decreased balance;Decreased mobility       PT Treatment Interventions DME instruction;Therapeutic activities;Gait training;Therapeutic exercise;Patient/family education;Stair training;Balance training;Functional mobility training    PT Goals (Current goals can be found in the Care Plan section)  Acute Rehab PT Goals PT Goal Formulation: With patient Time For Goal Achievement: 06/21/18 Potential to Achieve Goals: Good    Frequency 7X/week   Barriers to discharge        Co-evaluation               AM-PAC PT "6 Clicks" Daily Activity  Outcome Measure Difficulty turning over in bed (including adjusting bedclothes, sheets and blankets)?: A Little Difficulty moving from lying on back to sitting on the side of the bed? : A Little Difficulty sitting down on and standing up from a chair with arms (e.g., wheelchair, bedside commode, etc,.)?: A Little Help needed moving to and from a bed to chair (including a wheelchair)?: A Little Help needed walking in hospital room?: A Little Help needed climbing 3-5 steps with a railing? : A Lot 6 Click Score: 17     End of Session Equipment Utilized During Treatment: Gait belt Activity Tolerance: Patient tolerated treatment well;No increased pain;Patient limited by fatigue;Treatment limited secondary to medical complications (Comment)(high BP ) Patient left: in chair;with call bell/phone within reach;with SCD's reapplied Nurse Communication: Mobility status PT Visit Diagnosis: Other abnormalities of gait and mobility (R26.89);Difficulty in walking, not elsewhere classified (R26.2)    Time: 6222-9798 PT Time Calculation (min) (ACUTE ONLY): 26 min   Charges:   PT Evaluation $PT Eval Low Complexity: 1 Low        Tynleigh Birt Conception Chancy, PT, DPT  Pager # [phone]    Breelyn Icard D Alwilda Gilland 06/21/2018, 5:27 PM

## 2018-06-21 NOTE — Discharge Instructions (Signed)

## 2018-06-21 NOTE — Op Note (Signed)
NAME: Bradley Perez    MEDICAL RECORD NO.: [id number]   FACILITY: [facility] OF BIRTH: 08-04-1961  PHYSICIAN: Pietro Cassis. Alvan Dame, M.D.    DATE OF PROCEDURE: 06/21/2018    OPERATIVE REPORT   PREOPERATIVE DIAGNOSIS: Left knee medial compartment osteoarthritis.   POSTOPERATIVE DIAGNOSIS: Left knee medial compartment osteoarthritis.  PROCEDURE: Left partial knee replacement utilizing Biomet Oxford knee  component, size medium femur, a left medial size C tibial tray with a size 4 mm insert.   SURGEON: Pietro Cassis. Alvan Dame, M.D.   ASSISTANT: Danae Orleans, PAC.  Please note that Mr. Bradley Perez was present for the entirety of the case,  utilized for preoperative positioning, perioperative retractor  management, general facilitation of the case and primary wound closure.   ANESTHESIA: regional and spinal  SPECIMENS: None.   COMPLICATIONS: None.  DRAINS: None   TOURNIQUET TIME: 31 minutes at 250 mmHg.   INDICATIONS FOR PROCEDURE: The patient is a 57 y.o. patient of mine who presented for evaluation of left knee pain.  They presented with primary complaints of pain on the medial side of their knee. Radiographs revealed advanced medial compartment arthritis with specifically an antero-medial wear pattern.  There was bone on bone changes noted with subchondral sclerosis and osteophytes present. The patient has had progressive problems failing to respond to conservative measures of medications, injections and activity modification. Risks of infection, DVT, component failure, need for future revision surgery were all discussed and reviewed.  Consent was obtained for benefit of pain relief.   PROCEDURE IN DETAIL: The patient was brought to the operative theater.  Once adequate anesthesia, preoperative antibiotics, 2 gm Ancef, 1 gm of Tranexamic Acid, and 10 mg of Decadron administered, the patient was positioned in supine position with a left thigh tourniquet  placed. The left lower extremity was  prepped and draped in sterile  fashion with the leg on the Oxford leg holder.  The leg was allowed to flex to 120 degrees. A time-out  was performed identifying the patient, planned procedure, and extremity.  The leg was exsanguinated, tourniquet elevated to 250 mmHg. A midline  incision was made from the proximal pole of the patella to the tibial tubercle. A  soft tissue plane was created and partial median arthrotomy was then  made to allow for subluxation of the patella. Following initial synovectomy and  debridement, the osteophytes were removed off the medial aspect of the  knee.   Attention was first directed to the tibia. The tibial  extramedullary guide was positioned over the anterior crest of the tibia  and pinned into position, and using a measured resection guide from the  [place] system, a 4 mm resection was made off the proximal tibia. First  the reciprocating saw along the medial aspect of the tibial spines, then the oscillating saw.    At this point, I sized this cut surface seem to be best fit for a size C tibial tray.  With the retractors out of the wound and the knee held at 90 degrees the size 4 feeler gauge had appropriate tension on the medial ligament.   At this point, the femoral canal was opened with a drill and the  intramedullary rod passed.  The medium posterior cutting block guide was placed on the tibial cut surface and positioned over the mid portion of the medial femoral condyle.  The orientation was set using the articulating guide that mates the femoral guide to the intramedullary rod.  The 2 drill holes were  made into the distal femur.  The posterior guide was then impacted into place and the posterior  femoral cut made.  At this point, I milled the distal femur with a size 4 spigot in place. At this point, we did a trial reduction of the medium femur, size size C tibial tray and the 4 feeler gauge. At 90 degrees of  flexion and at 20 degrees of flexion the  knee had symmetric tension on  the ligaments.   Given these findings, the trial femoral component was removed. Final preparation of tibia was carried out by pinning it in position. Then  using a reciprocating saw I removed bone for the keel. Further bone was  removed with an osteotome.  Trial reduction was now carried out with the medium femur, the left keeled size C tibial tray, and the size 4 lollipop insert. The balance of the  ligaments appeared to be symmetric at 20 degrees and 90 degrees. Given  all these findings, the trial components were removed.   Cement was mixed. The final components were opened. The knee was irrigated with  normal saline solution. The peri-articular injection was placed.  Then final debridements of the  soft tissue was carried out, I also drilled the sclerotic bone with a drill.  The final components were cemented with a single batch of cement in a  two-stage technique with the tibial component cemented first. The knee  was then brought  to 45 degrees of flexion with the size 4 feeler gauge, held with pressure for a minute and half.  After this the femoral component was cemented in place.  The knee was again held at 45 degrees of flexion while the cement fully cured.  Excess cement was removed throughout the knee. Tourniquet was let down  after 31 minutes. After the cement had fully cured and excessive cement  was removed throughout the knee there was no visualized cement present.   The final left medial size 4 insert to match the medium femur was chosen and snapped into position. We re-irrigated  the knee. The extensor mechanism  was then reapproximated using a #1 Vicryl and #0 Stratafix sutures with the knee in flexion. The  remaining wound was closed with 2-0 Vicryl and a running 4-0 Monocryl.  The knee was cleaned, dried, and dressed sterilely using Dermabond and  Aquacel dressing. The patient  was brought to the recovery room, Ace wrap in place,  tolerating the  procedure well. He will be in the hospital for overnight observation.  We will initiate physical therapy and progress to ambulate.     Pietro Cassis Alvan Dame, M.D.

## 2018-06-21 NOTE — Progress Notes (Signed)
Assisted Dr. Hatchett with left, ultrasound guided, adductor canal block. Side rails up, monitors on throughout procedure. See vital signs in flow sheet. Tolerated Procedure well. 

## 2018-06-22 ENCOUNTER — Encounter (HOSPITAL_COMMUNITY): Payer: Self-pay | Admitting: Orthopedic Surgery

## 2018-06-22 DIAGNOSIS — M1712 Unilateral primary osteoarthritis, left knee: Secondary | ICD-10-CM | POA: Diagnosis not present

## 2018-06-22 LAB — CBC
HCT: 37.4 % — ABNORMAL LOW (ref 39.0–52.0)
Hemoglobin: 12.7 g/dL — ABNORMAL LOW (ref 13.0–17.0)
MCH: 27.5 pg (ref 26.0–34.0)
MCHC: 34 g/dL (ref 30.0–36.0)
MCV: 81.1 fL (ref 78.0–100.0)
PLATELETS: 231 10*3/uL (ref 150–400)
RBC: 4.61 MIL/uL (ref 4.22–5.81)
RDW: 12.7 % (ref 11.5–15.5)
WBC: 18.9 10*3/uL — AB (ref 4.0–10.5)

## 2018-06-22 LAB — BASIC METABOLIC PANEL
Anion gap: 6 (ref 5–15)
BUN: 26 mg/dL — AB (ref 6–20)
CALCIUM: 8.7 mg/dL — AB (ref 8.9–10.3)
CO2: 26 mmol/L (ref 22–32)
CREATININE: 2.09 mg/dL — AB (ref 0.61–1.24)
Chloride: 106 mmol/L (ref 98–111)
GFR calc Af Amer: 39 mL/min — ABNORMAL LOW (ref >60)
GFR calc non Af Amer: 33 mL/min — ABNORMAL LOW (ref >60)
GLUCOSE: 165 mg/dL — AB (ref 70–99)
Potassium: 4.2 mmol/L (ref 3.5–5.1)
Sodium: 138 mmol/L (ref 135–145)

## 2018-06-22 LAB — GLUCOSE, CAPILLARY
Glucose-Capillary: 159 mg/dL — ABNORMAL HIGH (ref 70–99)
Glucose-Capillary: 201 mg/dL — ABNORMAL HIGH (ref 70–99)

## 2018-06-22 NOTE — Progress Notes (Signed)
     Subjective: 1 Day Post-Op Procedure(s) (LRB): LEFT UNICOMPARTMENTAL KNEE-MEDIALLY (Left)   Patient reports pain as mild, pain well controlled. No events throughout the night. Discussed his kidney function and checking up with his PCP.  Ready to be discharged home.   Patient's anticipated LOS is less than 2 midnights, meeting these requirements: - Younger than 92 - Lives within 1 hour of care - Has a competent adult at home to recover with post-op recover - NO history of  - Chronic pain requiring opiods  - Coronary Artery Disease  - Heart failure  - Heart attack  - Stroke  - DVT/VTE  - Cardiac arrhythmia  - Respiratory Failure/COPD  - Anemia  - Advanced Liver disease       Objective:   VITALS:   Vitals:   06/22/18 0118 06/22/18 0604  BP: (!) 155/78 (!) 155/83  Pulse: 88 74  Resp: 18 17  Temp: 98.5 F (36.9 C) 98.6 F (37 C)  SpO2: 98% 94%    Dorsiflexion/Plantar flexion intact Incision: dressing C/D/I No cellulitis present Compartment soft  LABS Recent Labs    06/22/18 0529  HGB 12.7*  HCT 37.4*  WBC 18.9*  PLT 231    Recent Labs    06/22/18 0529  NA 138  K 4.2  BUN 26*  CREATININE 2.09*  GLUCOSE 165*     Assessment/Plan: 1 Day Post-Op Procedure(s) (LRB): LEFT UNICOMPARTMENTAL KNEE-MEDIALLY (Left) Foley cath d/c'ed Advance diet Up with therapy D/C IV fluids Discharge home Follow up in 2 weeks at [facility] ([place]). Follow up with OLIN,Eino Whitner D in 2 weeks.  Contact information:  [facility]) [address], [place] [phone]    Obese (BMI 30-39.9) Estimated body mass index is 37.95 kg/m as calculated from the following:   Height as of this encounter: 5\' 9"  (1.753 m).   Weight as of this encounter: 116.6 kg. Patient also counseled that weight may inhibit the healing process Patient counseled that losing weight will help with future  health issues        West Pugh. Geralene Afshar   PAC  06/22/2018, 8:06 AM

## 2018-06-22 NOTE — Progress Notes (Signed)
Physical Therapy Treatment Patient Details Name: Bradley Perez MRN: [id number] DOB: 12-Aug-1961 Today's Date: 06/22/2018    History of Present Illness Pt is a 57 YO male s/p L partial knee replacement of the medial compartment. PMH includes OA, DM II, ED, HTN, hypokalemia. Surgical history includes hernia repair, L knee arthroscopy 2013.    PT Comments    Pt with improved tolerance for mobility today, progressing to ambulating 2x150 ft with RW with supervision assist. Pt proficient in home exercise program, personalized exercise handout administered. Additionally, pt demonstrated proficiency in stair climbing with both cane and 2-rail method. Pt states that he has cane at home if he needs it, and he has no further questions for PT. Pt plans to d/c today, no further acute PT needs.    Follow Up Recommendations  Follow surgeon's recommendation for DC plan and follow-up therapies(Surgeon recommending OPPT )     Equipment Recommendations  Rolling walker with 5" wheels(delivered to room and sized )    Recommendations for Other Services       Precautions / Restrictions Precautions Precautions: Knee Restrictions Weight Bearing Restrictions: No Other Position/Activity Restrictions: WBAT     Mobility  Bed Mobility Overal bed mobility: Needs Assistance Bed Mobility: Supine to Sit     Supine to sit: Supervision;HOB elevated     General bed mobility comments: Increased time to perform. HOB elevated because pt has this function on his bed at home.   Transfers Overall transfer level: Needs assistance Equipment used: Rolling walker (2 wheeled) Transfers: Sit to/from Stand Sit to Stand: Supervision         General transfer comment: No verbal cuing required, supervision for safety.  Ambulation/Gait Ambulation/Gait assistance: Min guard Gait Distance (Feet): 300 Feet(2x150 ft) Assistive device: Rolling walker (2 wheeled) Gait Pattern/deviations: Decreased weight shift to  left;Step-through pattern Gait velocity: slightly decreased    General Gait Details: No episodes of knee buckling today. Min guard for safety. No verbal cuing required for sequencing, placement in RW, posture.    Stairs Stairs: Yes Stairs assistance: Min guard Stair Management: Two rails;With cane Number of Stairs: 12(3 sets of 4 stairs, ascending and descending) General stair comments: Pt min guard for safety. Verbal cuing for sequencing initially, no verbal cuing required during 2nd and 3rd set of stair climbing. Pt tried both cane and 2-rail method, both performed proficiently. Pt states wife will guard him with stair climbing initially upon return home.    Wheelchair Mobility    Modified Rankin (Stroke Patients Only)       Balance Overall balance assessment: Modified Independent                                          Cognition Arousal/Alertness: Awake/alert Behavior During Therapy: WFL for tasks assessed/performed Overall Cognitive Status: Within Functional Limits for tasks assessed                                        Exercises Total Joint Exercises Ankle Circles/Pumps: AROM;Both;Supine;20 reps Quad Sets: AROM;Left;10 reps;Supine Towel Squeeze: AROM;Both;10 reps;Supine Short Arc Quad: AROM;Left;10 reps;Supine Heel Slides: AAROM;Left;10 reps;Supine Hip ABduction/ADduction: AAROM;Left;10 reps;Supine Straight Leg Raises: AROM;Left;10 reps;Supine Long Arc Quad: AROM;Left;10 reps;Supine Knee Flexion: AAROM;Left;10 reps;Seated(towel under L foot to assist with sliding) Goniometric ROM: Knee AAROM approximately 5-90 degrees.  General Comments        Pertinent Vitals/Pain Pain Assessment: 0-10 Pain Score: 4  Pain Descriptors / Indicators: Grimacing;Sore Pain Intervention(s): Limited activity within patient's tolerance;Repositioned;Ice applied;Monitored during session;Premedicated before session    Home Living                       Prior Function            PT Goals (current goals can now be found in the care plan section) Acute Rehab PT Goals PT Goal Formulation: With patient Time For Goal Achievement: 06/21/18 Potential to Achieve Goals: Good Progress towards PT goals: Progressing toward goals    Frequency    7X/week      PT Plan Current plan remains appropriate    Co-evaluation              AM-PAC PT "6 Clicks" Daily Activity  Outcome Measure  Difficulty turning over in bed (including adjusting bedclothes, sheets and blankets)?: None Difficulty moving from lying on back to sitting on the side of the bed? : A Little Difficulty sitting down on and standing up from a chair with arms (e.g., wheelchair, bedside commode, etc,.)?: A Little Help needed moving to and from a bed to chair (including a wheelchair)?: A Little Help needed walking in hospital room?: A Little Help needed climbing 3-5 steps with a railing? : A Little 6 Click Score: 19    End of Session Equipment Utilized During Treatment: Gait belt Activity Tolerance: Patient tolerated treatment well;No increased pain Patient left: in chair;with call bell/phone within reach;with nursing/sitter in room Nurse Communication: Mobility status PT Visit Diagnosis: Other abnormalities of gait and mobility (R26.89);Difficulty in walking, not elsewhere classified (R26.2)     Time: 8675-4492 PT Time Calculation (min) (ACUTE ONLY): 43 min  Charges:  $Gait Training: 8-22 mins $Therapeutic Exercise: 8-22 mins $Therapeutic Activity: 8-22 mins                     Pryce Folts Conception Chancy, PT, DPT  Pager # [phone]     Anagha Loseke D Hali Balgobin 06/22/2018, 11:26 AM

## 2018-06-28 NOTE — Discharge Summary (Signed)
Physician Discharge Summary  Patient ID: Bradley Perez MRN: [id number] DOB/AGE: 11-Jan-1961 57 y.o.  Admit date: 06/21/2018 Discharge date: 06/22/2018   Procedures:  Procedure(s) (LRB): LEFT UNICOMPARTMENTAL KNEE-MEDIALLY (Left)  Attending Physician:  Dr. 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 Arthritis   . Bell's palsy   . Diabetes mellitus    type II   . ED (erectile dysfunction)   . History of colon polyps   . Hypercholesteremia   . Hypertension   . Hypokalemia   . Low testosterone   . OSA (obstructive sleep apnea)    cpap - does not know settings   . Overweight     HPI:     Bradley Perez, 57 y.o. male , has a history of pain and functional disability in the left and has failed non-surgical conservative treatments for greater than 12 weeks to include NSAID's and/or analgesics, corticosteriod injections, viscosupplementation injections and activity modification.  Onset of symptoms was gradual, starting 1+ years ago with gradually worsening course since that time. The patient noted prior procedures on the knee to include  arthroscopy and menisectomy on the left knee(s).  Patient currently rates pain in the left knee(s) at 8 out of 10 with activity. Patient has worsening of pain with activity and weight bearing, pain that interferes with activities of daily living, pain with passive range of motion, crepitus and joint swelling.  Patient has evidence of periarticular osteophytes and joint space narrowing of the medial compartment by imaging studies.  There is no active infection.  Risks, benefits and expectations were discussed with the patient.  Risks including but not limited to the risk of anesthesia, blood clots, nerve damage, blood vessel damage, failure of the prosthesis, infection and up to and including death.  Patient  understand the risks, benefits and expectations and wishes to proceed with surgery.   PCP: Chesley Noon, MD   Discharged Condition: good  Hospital Course:  Patient underwent the above stated procedure on 06/21/2018. Patient tolerated the procedure well and brought to the recovery room in good condition and subsequently to the floor.  POD #1 BP: 155/83 ; Pulse: 74 ; Temp: 98.6 F (37 C) ; Resp: 17 Patient reports pain as mild, pain well controlled. No events throughout the night. Discussed his kidney function and checking up with his PCP.  Ready to be discharged home. Dorsiflexion/plantar flexion intact, incision: dressing C/D/I, no cellulitis present and compartment soft.   LABS  Basename    HGB     12.7  HCT     37.4    Discharge Exam: General appearance: alert, cooperative and no distress Extremities: Homans sign is negative, no sign of DVT, no edema, redness or tenderness in the calves or thighs and no ulcers, gangrene or trophic changes  Disposition:  Home with follow up in 2 weeks   Follow-up Information    Paralee Cancel, MD. Schedule an appointment as soon as possible for a visit in 2 weeks.   Specialty:  Orthopedic Surgery Contact information: [address] [place] [address] [phone]           Discharge Instructions    Call MD / Call 911   Complete by:  As directed    If you experience chest pain or shortness of breath, CALL 911 and be transported to the  hospital emergency room.  If you develope a fever above 101 F, pus (white drainage) or increased drainage or redness at the wound, or calf pain, call your surgeon's office.   Change dressing   Complete by:  As directed    Maintain surgical dressing until follow up in the clinic. If the edges start to pull up, may reinforce with tape. If the dressing is no longer working, may remove and cover with gauze and tape, but must keep the area dry and clean.  Call with any questions or concerns.     Constipation Prevention   Complete by:  As directed    Drink plenty of fluids.  Prune juice may be helpful.  You may use a stool softener, such as Colace (over the counter) 100 mg twice a day.  Use MiraLax (over the counter) for constipation as needed.   Diet - low sodium heart healthy   Complete by:  As directed    Discharge instructions   Complete by:  As directed    Maintain surgical dressing until follow up in the clinic. If the edges start to pull up, may reinforce with tape. If the dressing is no longer working, may remove and cover with gauze and tape, but must keep the area dry and clean.  Follow up in 2 weeks at [facility]. Call with any questions or concerns.   Increase activity slowly as tolerated   Complete by:  As directed    Weight bearing as tolerated with assist device (walker, cane, etc) as directed, use it as long as suggested by your surgeon or therapist, typically at least 4-6 weeks.   TED hose   Complete by:  As directed    Use stockings (TED hose) for 2 weeks on both leg(s).  You may remove them at night for sleeping.      Allergies as of 06/22/2018      Reactions   Metformin And Related    Diarrhea      Medication List    STOP taking these medications   acetaminophen 500 MG tablet Commonly known as:  TYLENOL     TAKE these medications   amLODipine 10 MG tablet Commonly known as:  NORVASC Take 1 tablet (10 mg total) by mouth every morning.   aspirin 81 MG chewable tablet Chew 1 tablet (81 mg total) by mouth 2 (two) times daily. Take for 4 weeks, then resume regular dose.   carvedilol 25 MG tablet Commonly known as:  COREG Take 25 mg by mouth 2 (two) times daily.   docusate sodium 100 MG capsule Commonly known as:  COLACE Take 1 capsule (100 mg total) by mouth 2 (two) times daily.   ferrous sulfate 325 (65 FE) MG tablet Take 1 tablet (325 mg total) by mouth 3 (three) times daily with meals.   hydrALAZINE 50 MG tablet Commonly known  as:  APRESOLINE Take 50 mg by mouth 2 (two) times daily.   HYDROcodone-acetaminophen 7.5-325 MG tablet Commonly known as:  NORCO Take 1-2 tablets by mouth every 4 (four) hours as needed for moderate pain.   lisinopril 10 MG tablet Commonly known as:  PRINIVIL,ZESTRIL Take 10 mg by mouth daily.   metFORMIN 500 MG 24 hr tablet Commonly known as:  GLUCOPHAGE-XR Take 500 mg by mouth daily.   methocarbamol 500 MG tablet Commonly known as:  ROBAXIN Take 1 tablet (500 mg total) by mouth every 6 (six) hours as needed for muscle spasms.   polyethylene glycol packet  Commonly known as:  MIRALAX / GLYCOLAX Take 17 g by mouth 2 (two) times daily.   rosuvastatin 40 MG tablet Commonly known as:  CRESTOR Take 40 mg by mouth at bedtime.   spironolactone 50 MG tablet Commonly known as:  ALDACTONE Take 50 mg by mouth 2 (two) times daily.   TRULICITY 1.47 WL/2.9VF Sopn Generic drug:  Dulaglutide Inject 0.75 mg into the skin every Friday.            Discharge Care Instructions  (From admission, onward)         Start     Ordered   06/22/18 0000  Change dressing    Comments:  Maintain surgical dressing until follow up in the clinic. If the edges start to pull up, may reinforce with tape. If the dressing is no longer working, may remove and cover with gauze and tape, but must keep the area dry and clean.  Call with any questions or concerns.   06/22/18 4734           Signed: West Pugh. Barth Trella   PA-C  06/28/2018, 5:25 PM

## 2019-01-18 ENCOUNTER — Other Ambulatory Visit: Payer: Self-pay | Admitting: Dentistry

## 2019-01-18 DIAGNOSIS — D165 Benign neoplasm of lower jaw bone: Secondary | ICD-10-CM

## 2019-01-23 ENCOUNTER — Other Ambulatory Visit: Payer: Self-pay | Admitting: Dentistry

## 2019-01-25 ENCOUNTER — Ambulatory Visit
Admission: RE | Admit: 2019-01-25 | Discharge: 2019-01-25 | Disposition: A | Discharge status: Home / Self Care | Payer: Self-pay | Source: Ambulatory Visit | Attending: Dentistry | Admitting: Dentistry

## 2019-01-25 DIAGNOSIS — D165 Benign neoplasm of lower jaw bone: Secondary | ICD-10-CM

## 2019-01-25 IMAGING — CT CT MAXILLOFACIAL WITH CONTRAST
3 of 6 series · 16 of 47 positions shown, 19 images · IV contrast (iopamidol)
Comparison: None.

CLINICAL DATA: Ameloblastoma jaw.

EXAM:
CT MAXILLOFACIAL WITH CONTRAST
TECHNIQUE: Multidetector CT imaging of the maxillofacial structures was
performed with intravenous contrast. Multiplanar CT image
reconstructions were also generated.
CONTRAST:  75mL [ZZ] IOPAMIDOL ([ZZ]) INJECTION 61%

[Series 2: maxillofacial 2.00 hr60 s3 · axial · 0.47mm/px · z∈[-618,-446]mm · 11 of 102 slices shown, 14 images]
[im 8/102  brain]
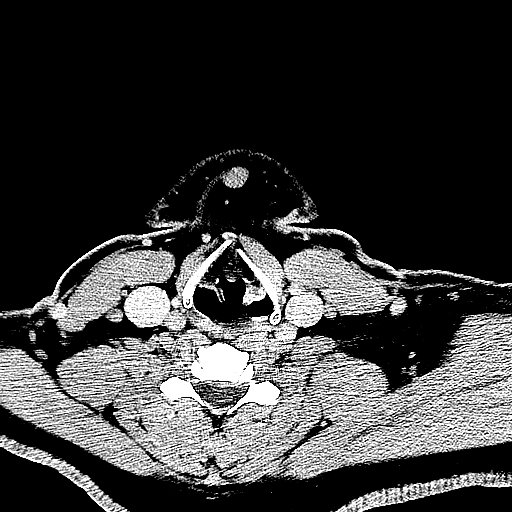
[im 8/102  bone]
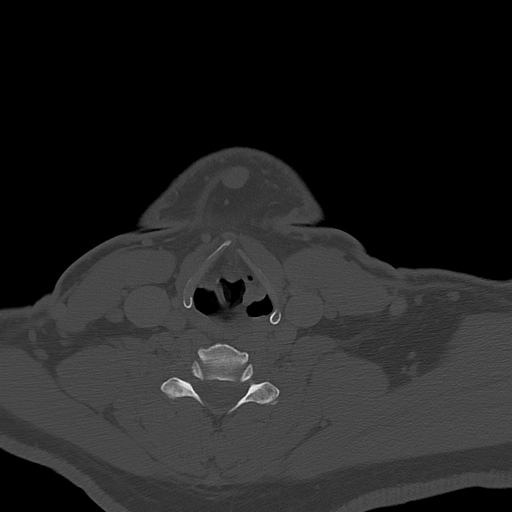
[im 15/102  bone]
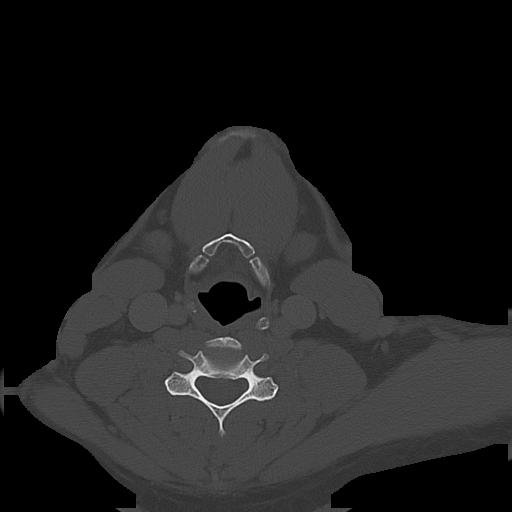
[im 22/102  bone]
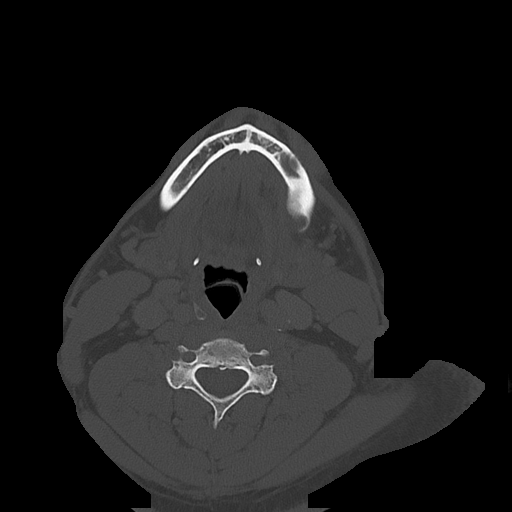
[im 37/102  bone]
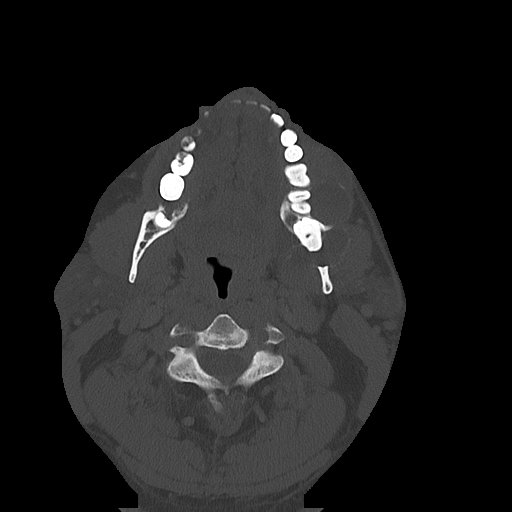
[im 44/102  brain]
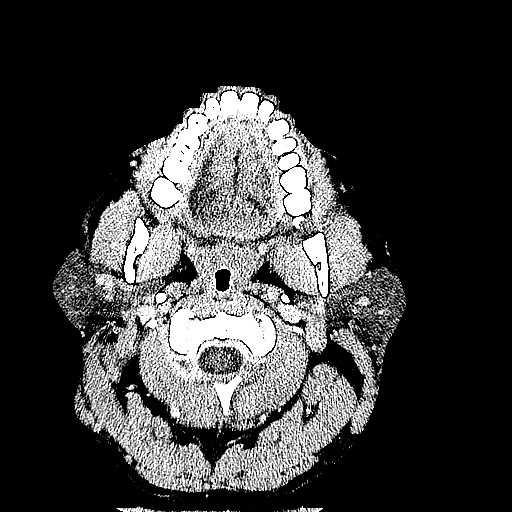
[im 44/102  bone]
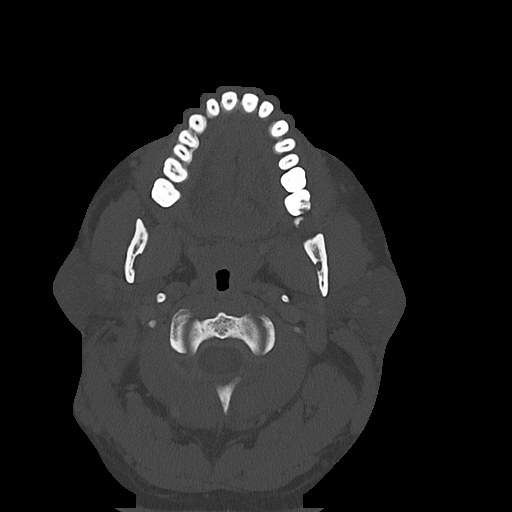
[im 51/102  bone]
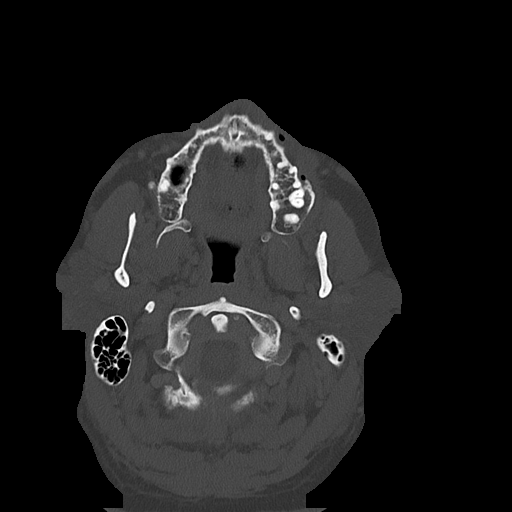
[im 58/102  bone]
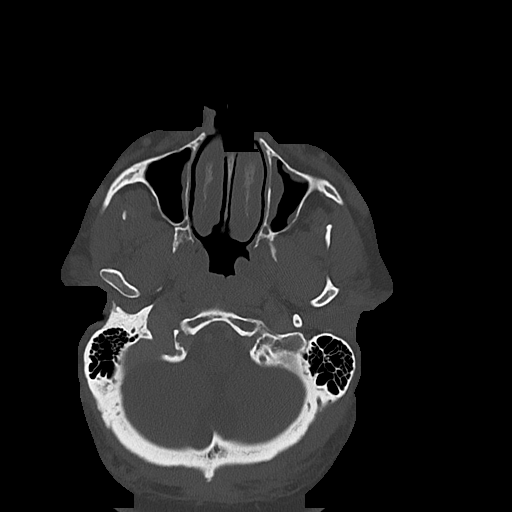
[im 65/102  bone]
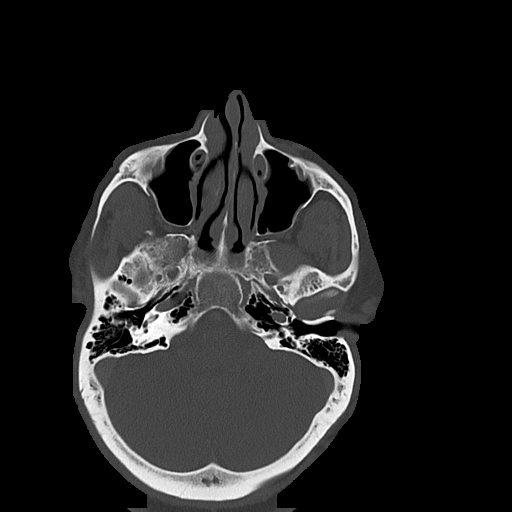
[im 80/102  brain]
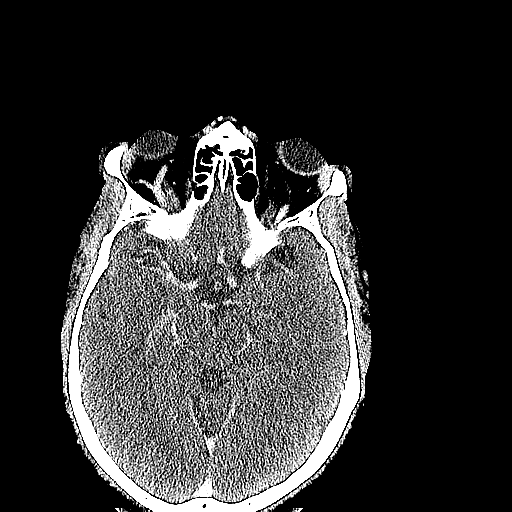
[im 80/102  bone]
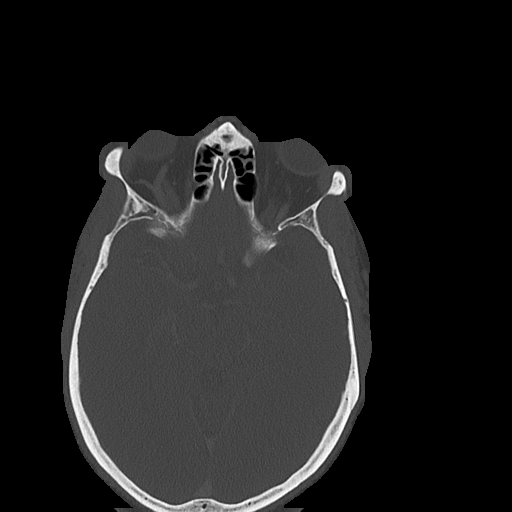
[im 87/102  bone]
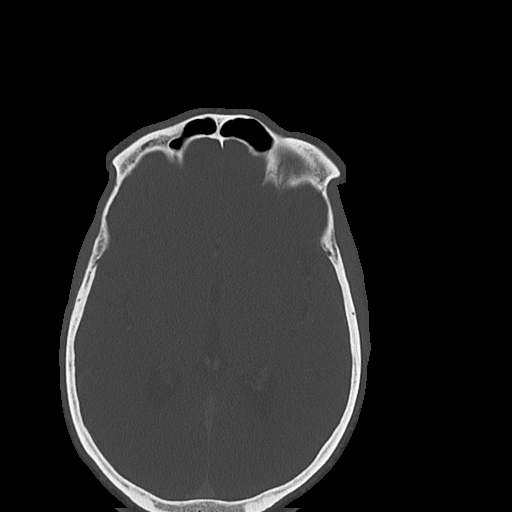
[im 94/102  bone]
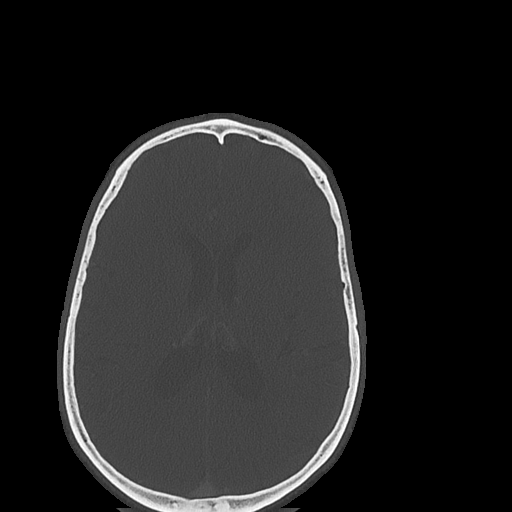

[Series 6: maxillofacial 2.00 hr60 s3 cor · coronal · 0.40mm/px · 3 of 127 slices shown]
[im 32/127  bone]
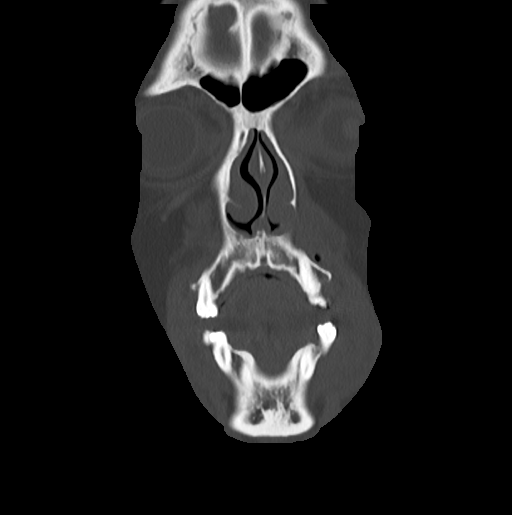
[im 64/127  bone]
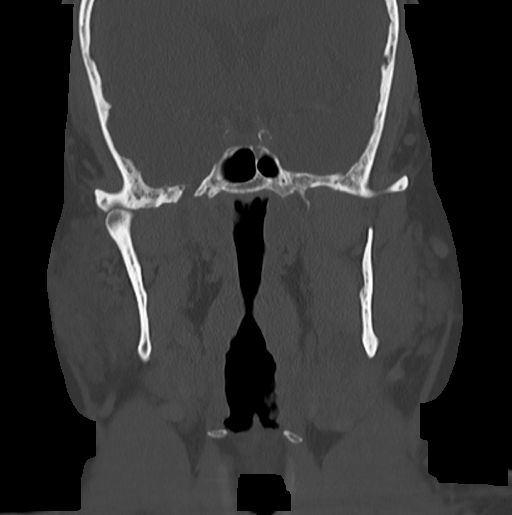
[im 95/127  bone]
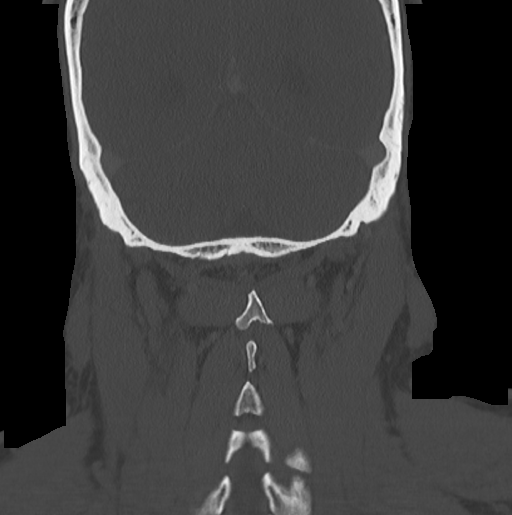

[Series 12: maxillofacial 2.00 hr40 s3 sag · sagittal · 0.40mm/px · 2 of 98 slices shown]
[im 33/98  bone]
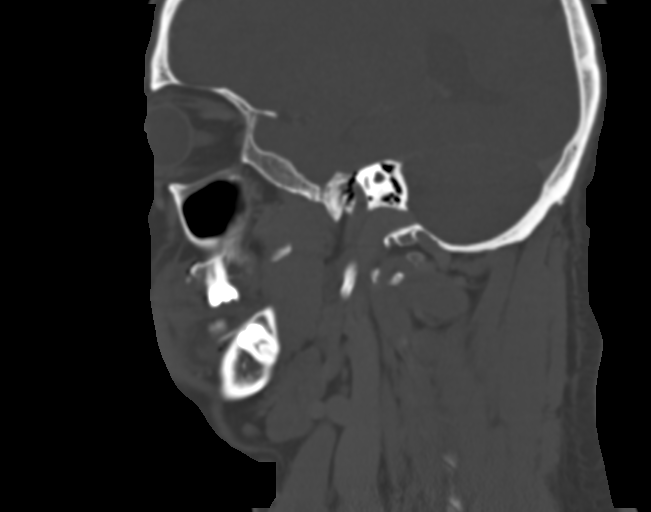
[im 65/98  bone]
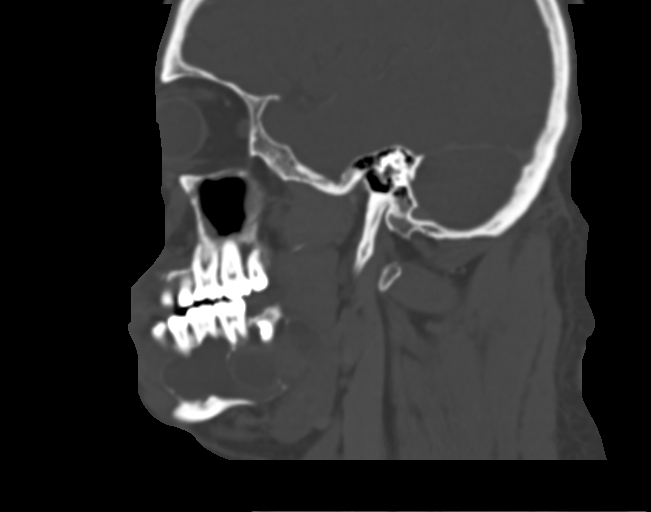

[16 of 47 positions shown; findings below may reference images not displayed]

FINDINGS: Osseous: Large bubbly lytic lesion in the left mandible. Marked
cortical thinning and expansion of the mandible. Multilocular cystic
changes in the tumor. There are several areas of soft tissue density
within the posterior portion of the tumor. Overall the tumor
measures approximately 3.3 x 2.7 x 6.7 cm. No pathologic fracture.

Impacted left lower third molar projects into the cystic cavity.
Floating left first and second molars also project into the tumor
cyst. Otherwise the teeth appear healthy.

Orbits: Negative

Sinuses: Mild mucosal edema paranasal sinuses most notably left
maxillary sinus.

Soft tissues: Negative for adenopathy

Limited intracranial: Negative
IMPRESSION: Large bubbly multilocular cystic mass in the left mandible. The mass
is expansile with marked thinning of bone. No pathologic fracture.
Findings consistent with ameloblastoma.

## 2019-01-25 MED ORDER — IOPAMIDOL (ISOVUE-300) INJECTION 61%
75.0000 mL | Freq: Once | INTRAVENOUS | Status: AC | PRN
  Administered 2019-01-25: 75 mL via INTRAVENOUS

## 2019-11-08 HISTORY — PX: PROSTATE BIOPSY: SHX241

## 2020-07-22 ENCOUNTER — Encounter: Payer: Self-pay | Admitting: Internal Medicine

## 2020-09-07 ENCOUNTER — Other Ambulatory Visit: Payer: Self-pay

## 2020-09-07 ENCOUNTER — Ambulatory Visit (AMBULATORY_SURGERY_CENTER): Payer: Self-pay

## 2020-09-07 VITALS — Ht 70.0 in | Wt 245.8 lb

## 2020-09-07 DIAGNOSIS — Z86010 Personal history of colonic polyps: Secondary | ICD-10-CM

## 2020-09-07 MED ORDER — SUTAB 1479-225-188 MG PO TABS: 1.0000 | ORAL_TABLET | ORAL | 0 refills | Status: DC

## 2020-09-07 NOTE — Progress Notes (Signed)
No egg or soy allergy known to patient  No issues with past sedation with any surgeries or procedures No intubation problems in the past  No FH of Malignant Hyperthermia No diet pills per patient No home 02 use per patient  No blood thinners per patient  Pt denies issues with constipation  No A fib or A flutter  EMMI video to pt or via [place] 19 guidelines implemented in [place] today with Pt and RN  Coupon given to pt in [facility] today , Code to Pharmacy  COVID vaccines completed on 09/07/2020 per pt;  Due to the COVID-19 pandemic we are asking patients to follow these guidelines. Please only bring one care partner. Please be aware that your care partner may wait in the car in the parking lot or if they feel like they will be too hot to wait in the car, they may wait in the lobby on the 4th floor. All care partners are required to wear a mask the entire time (we do not have any that we can provide them), they need to practice social distancing, and we will do a Covid check for all patient's and care partners when you arrive. Also we will check their temperature and your temperature. If the care partner waits in their car they need to stay in the parking lot the entire time and we will call them on their cell phone when the patient is ready for discharge so they can bring the car to the front of the building. Also all patient's will need to wear a mask into building.

## 2020-09-21 ENCOUNTER — Encounter: Payer: Self-pay | Admitting: Internal Medicine

## 2020-09-21 ENCOUNTER — Ambulatory Visit (AMBULATORY_SURGERY_CENTER): Payer: Medicaid Other | Admitting: Internal Medicine

## 2020-09-21 ENCOUNTER — Other Ambulatory Visit: Payer: Self-pay

## 2020-09-21 VITALS — BP 150/65 | HR 70 | Temp 97.7°F | Resp 15 | Ht 70.0 in | Wt 245.8 lb

## 2020-09-21 DIAGNOSIS — D124 Benign neoplasm of descending colon: Secondary | ICD-10-CM

## 2020-09-21 DIAGNOSIS — Z86010 Personal history of colonic polyps: Secondary | ICD-10-CM

## 2020-09-21 MED ORDER — SODIUM CHLORIDE 0.9 % IV SOLN: 500.0000 mL | Freq: Once | INTRAVENOUS | Status: AC

## 2020-09-21 NOTE — Op Note (Signed)
[name] Name: Bradley Perez Procedure Date: 09/21/2020 11:53 AM MRN: [id number] Endoscopist: Jerene Bears , MD Age: 59 Referring MD:  Date of Birth: 02/19/1961 Gender: Male Account #: [id number] Procedure:                Colonoscopy Indications:              High risk colon cancer surveillance: Personal                            history of non-advanced adenoma, Last colonoscopy:                            July 2013 Medicines:                Monitored Anesthesia Care Procedure:                Pre-Anesthesia Assessment:                           - Prior to the procedure, a History and Physical                            was performed, and patient medications and                            allergies were reviewed. The patient's tolerance of                            previous anesthesia was also reviewed. The risks                            and benefits of the procedure and the sedation                            options and risks were discussed with the patient.                            All questions were answered, and informed consent                            was obtained. Prior Anticoagulants: The patient has                            taken no previous anticoagulant or antiplatelet                            agents. ASA Grade Assessment: III - A patient with                            severe systemic disease. After reviewing the risks                            and benefits, the patient was deemed in  satisfactory condition to undergo the procedure.                           After obtaining informed consent, the colonoscope                            was passed under direct vision. Throughout the                            procedure, the patient's blood pressure, pulse, and                            oxygen saturations were monitored continuously. The                            Colonoscope was introduced through the anus and                             advanced to the cecum, identified by appendiceal                            orifice and ileocecal valve. The colonoscopy was                            performed without difficulty. The patient tolerated                            the procedure well. The quality of the bowel                            preparation was excellent. The ileocecal valve,                            appendiceal orifice, and rectum were photographed. Scope In: 12:05:48 PM Scope Out: 12:21:44 PM Scope Withdrawal Time: 0 hours 13 minutes 49 seconds  Total Procedure Duration: 0 hours 15 minutes 56 seconds  Findings:                 The digital rectal exam was normal.                           A 15 mm polyp was found in the proximal descending                            colon. The polyp was flat. The polyp was removed                            with a piecemeal technique with wide resection                            margin using a cold snare. Resection and retrieval                            were complete.  A 2 mm polyp was found in the descending colon. The                            polyp was sessile. The polyp was removed with a                            cold snare. Resection and retrieval were complete.                           The exam was otherwise without abnormality on                            direct and retroflexion views. Complications:            No immediate complications. Estimated Blood Loss:     Estimated blood loss was minimal. Impression:               - One 15 mm polyp in the proximal descending colon,                            removed piecemeal using a cold snare. Resected and                            retrieved.                           - One 2 mm polyp in the descending colon, removed                            with a cold snare. Resected and retrieved.                           - The examination was otherwise normal on direct                             and retroflexion views. Recommendation:           - Patient has a contact number available for                            emergencies. The signs and symptoms of potential                            delayed complications were discussed with the                            patient. Return to normal activities tomorrow.                            Written discharge instructions were provided to the                            patient.                           -  Resume previous diet.                           - Continue present medications.                           - Await pathology results.                           - Repeat colonoscopy is recommended for                            surveillance (likely in 3 years). The colonoscopy                            date will be determined after pathology results                            from today's exam become available for review. Jerene Bears, MD 09/21/2020 12:24:55 PM This report has been signed electronically.

## 2020-09-21 NOTE — Progress Notes (Signed)
Called to room to assist during endoscopic procedure.  Patient ID and intended procedure confirmed with present staff. Received instructions for my participation in the procedure from the performing physician.  

## 2020-09-21 NOTE — Patient Instructions (Signed)
Information on polyps given to you today.  Await pathology results.  Resume previous diet and medications.  YOU HAD AN ENDOSCOPIC PROCEDURE TODAY AT THE [facility]:   Refer to the procedure report that was given to you for any specific questions about what was found during the examination.  If the procedure report does not answer your questions, please call your gastroenterologist to clarify.  If you requested that your care partner not be given the details of your procedure findings, then the procedure report has been included in a sealed envelope for you to review at your convenience later.  YOU SHOULD EXPECT: Some feelings of bloating in the abdomen. Passage of more gas than usual.  Walking can help get rid of the air that was put into your GI tract during the procedure and reduce the bloating. If you had a lower endoscopy (such as a colonoscopy or flexible sigmoidoscopy) you may notice spotting of blood in your stool or on the toilet paper. If you underwent a bowel prep for your procedure, you may not have a normal bowel movement for a few days.  Please Note:  You might notice some irritation and congestion in your nose or some drainage.  This is from the oxygen used during your procedure.  There is no need for concern and it should clear up in a day or so.  SYMPTOMS TO REPORT IMMEDIATELY:   Following lower endoscopy (colonoscopy or flexible sigmoidoscopy):  Excessive amounts of blood in the stool  Significant tenderness or worsening of abdominal pains  Swelling of the abdomen that is new, acute  Fever of 100F or higher   For urgent or emergent issues, a gastroenterologist can be reached at any hour by calling [phone]. Do not use MyChart messaging for urgent concerns.    DIET:  We do recommend a small meal at first, but then you may proceed to your regular diet.  Drink plenty of fluids but you should avoid alcoholic beverages for 24 hours.  ACTIVITY:  You should  plan to take it easy for the rest of today and you should NOT DRIVE or use heavy machinery until tomorrow (because of the sedation medicines used during the test).    FOLLOW UP: Our staff will call the number listed on your records 48-72 hours following your procedure to check on you and address any questions or concerns that you may have regarding the information given to you following your procedure. If we do not reach you, we will leave a message.  We will attempt to reach you two times.  During this call, we will ask if you have developed any symptoms of COVID 19. If you develop any symptoms (ie: fever, flu-like symptoms, shortness of breath, cough etc.) before then, please call [phone].  If you test positive for Covid 19 in the 2 weeks post procedure, please call and report this information to us.    If any biopsies were taken you will be contacted by phone or by letter within the next 1-3 weeks.  Please call us at [phone] if you have not heard about the biopsies in 3 weeks.    SIGNATURES/CONFIDENTIALITY: You and/or your care partner have signed paperwork which will be entered into your electronic medical record.  These signatures attest to the fact that that the information above on your After Visit Summary has been reviewed and is understood.  Full responsibility of the confidentiality of this discharge information lies with you and/or your care-partner. 

## 2020-09-21 NOTE — Progress Notes (Signed)
pt tolerated well. VSS. awake and to recovery. Report given to RN.  

## 2020-09-21 NOTE — Progress Notes (Signed)
Pt. Reports no change in his medical or surgical history since his pre-visit 09/07/2020.

## 2020-09-23 ENCOUNTER — Telehealth: Payer: Self-pay | Admitting: *Deleted

## 2020-09-23 NOTE — Telephone Encounter (Signed)
  Follow up Call-  Call back number 09/21/2020  Post procedure Call Back phone  # [phone]  Permission to leave phone message No  Some recent data might be hidden     Patient questions:  Do you have a fever, pain , or abdominal swelling? No. Pain Score  0 *  Have you tolerated food without any problems? Yes.    Have you been able to return to your normal activities? Yes.    Do you have any questions about your discharge instructions: Diet   No. Medications  No. Follow up visit  No.  Do you have questions or concerns about your Care? No.  Actions: * If pain score is 4 or above: 1. No action needed, pain <4.Have you developed a fever since your procedure? no  2.   Have you had an respiratory symptoms (SOB or cough) since your procedure? no  3.   Have you tested positive for COVID 19 since your procedure no  4.   Have you had any family members/close contacts diagnosed with the COVID 19 since your procedure?  no   If yes to any of these questions please route to Joylene John, RN and Joella Prince, RN

## 2020-09-24 ENCOUNTER — Encounter: Payer: Self-pay | Admitting: Internal Medicine

## 2023-09-28 ENCOUNTER — Encounter: Payer: Self-pay | Admitting: Internal Medicine

## 2024-05-24 ENCOUNTER — Encounter: Payer: Self-pay | Admitting: Advanced Practice Midwife
